# Patient Record
Sex: Male | Born: 1965
Health system: Southern US, Community
[De-identification: ages and names within clinical notes are randomized; demographics above are authoritative.]

## PROBLEM LIST (undated history)

## (undated) DIAGNOSIS — D509 Iron deficiency anemia, unspecified: Secondary | ICD-10-CM

## (undated) HISTORY — PX: ANKLE SURGERY: SHX546

## (undated) HISTORY — PX: ELBOW SURGERY: SHX618

---

## 2002-02-24 ENCOUNTER — Emergency Department (HOSPITAL_COMMUNITY): Admission: EM | Admit: 2002-02-24 | Discharge: 2002-02-24 | Payer: Self-pay | Admitting: Emergency Medicine

## 2003-11-18 ENCOUNTER — Emergency Department (HOSPITAL_COMMUNITY): Admission: EM | Admit: 2003-11-18 | Discharge: 2003-11-18 | Payer: Self-pay | Admitting: Emergency Medicine

## 2003-12-09 ENCOUNTER — Emergency Department (HOSPITAL_COMMUNITY): Admission: EM | Admit: 2003-12-09 | Discharge: 2003-12-10 | Payer: Self-pay | Admitting: Emergency Medicine

## 2004-03-06 ENCOUNTER — Emergency Department (HOSPITAL_COMMUNITY): Admission: EM | Admit: 2004-03-06 | Discharge: 2004-03-06 | Payer: Self-pay | Admitting: Emergency Medicine

## 2005-09-27 ENCOUNTER — Emergency Department (HOSPITAL_COMMUNITY): Admission: EM | Admit: 2005-09-27 | Discharge: 2005-09-27 | Payer: Self-pay | Admitting: Emergency Medicine

## 2006-09-04 ENCOUNTER — Emergency Department (HOSPITAL_COMMUNITY): Admission: EM | Admit: 2006-09-04 | Discharge: 2006-09-05 | Payer: Self-pay | Admitting: Emergency Medicine

## 2009-09-22 ENCOUNTER — Emergency Department (HOSPITAL_COMMUNITY): Admission: EM | Admit: 2009-09-22 | Discharge: 2009-09-22 | Payer: Self-pay | Admitting: Emergency Medicine

## 2009-11-24 ENCOUNTER — Emergency Department (HOSPITAL_COMMUNITY): Admission: EM | Admit: 2009-11-24 | Discharge: 2009-11-24 | Payer: Self-pay | Admitting: Emergency Medicine

## 2010-06-19 LAB — URINALYSIS, ROUTINE W REFLEX MICROSCOPIC
Glucose, UA: NEGATIVE mg/dL
Hgb urine dipstick: NEGATIVE
Ketones, ur: 15 mg/dL — AB
Nitrite: NEGATIVE
Protein, ur: NEGATIVE mg/dL
Specific Gravity, Urine: 1.025 (ref 1.005–1.030)
Urobilinogen, UA: 1 mg/dL (ref 0.0–1.0)
pH: 6 (ref 5.0–8.0)

## 2010-07-25 ENCOUNTER — Emergency Department (HOSPITAL_COMMUNITY)
Admission: EM | Admit: 2010-07-25 | Discharge: 2010-07-25 | Disposition: A | Discharge status: Home / Self Care | Payer: Self-pay | Attending: Emergency Medicine | Admitting: Emergency Medicine

## 2010-07-25 ENCOUNTER — Emergency Department (HOSPITAL_COMMUNITY): Payer: Self-pay

## 2010-07-25 DIAGNOSIS — W138XXA Fall from, out of or through other building or structure, initial encounter: Secondary | ICD-10-CM | POA: Insufficient documentation

## 2010-07-25 DIAGNOSIS — Y92009 Unspecified place in unspecified non-institutional (private) residence as the place of occurrence of the external cause: Secondary | ICD-10-CM | POA: Insufficient documentation

## 2010-07-25 DIAGNOSIS — S92009A Unspecified fracture of unspecified calcaneus, initial encounter for closed fracture: Secondary | ICD-10-CM | POA: Insufficient documentation

## 2010-09-02 ENCOUNTER — Emergency Department (HOSPITAL_COMMUNITY)
Admission: EM | Admit: 2010-09-02 | Discharge: 2010-09-02 | Disposition: A | Discharge status: Home / Self Care | Payer: Self-pay | Attending: Emergency Medicine | Admitting: Emergency Medicine

## 2010-09-02 DIAGNOSIS — R21 Rash and other nonspecific skin eruption: Secondary | ICD-10-CM | POA: Insufficient documentation

## 2010-09-02 DIAGNOSIS — A779 Spotted fever, unspecified: Secondary | ICD-10-CM | POA: Insufficient documentation

## 2014-07-15 ENCOUNTER — Emergency Department (HOSPITAL_COMMUNITY): Payer: 59

## 2014-07-15 ENCOUNTER — Encounter (HOSPITAL_COMMUNITY): Payer: Self-pay | Admitting: Emergency Medicine

## 2014-07-15 ENCOUNTER — Emergency Department (HOSPITAL_COMMUNITY)
Admission: EM | Admit: 2014-07-15 | Discharge: 2014-07-15 | Disposition: A | Discharge status: Home / Self Care | Payer: 59 | Attending: Emergency Medicine | Admitting: Emergency Medicine

## 2014-07-15 DIAGNOSIS — M25461 Effusion, right knee: Secondary | ICD-10-CM | POA: Insufficient documentation

## 2014-07-15 DIAGNOSIS — Z720 Tobacco use: Secondary | ICD-10-CM | POA: Diagnosis not present

## 2014-07-15 DIAGNOSIS — M25561 Pain in right knee: Secondary | ICD-10-CM | POA: Diagnosis present

## 2014-07-15 MED ORDER — HYDROCODONE-ACETAMINOPHEN 5-325 MG PO TABS: 1.0000 | ORAL_TABLET | ORAL | Status: DC | PRN

## 2014-07-15 MED ORDER — IBUPROFEN 600 MG PO TABS: 600.0000 mg | ORAL_TABLET | Freq: Four times a day (QID) | ORAL | Status: DC | PRN

## 2014-07-15 MED ORDER — HYDROCODONE-ACETAMINOPHEN 5-325 MG PO TABS: 2.0000 | ORAL_TABLET | ORAL | Status: DC | PRN

## 2014-07-15 NOTE — Discharge Instructions (Signed)
Knee Effusion The medical term for having fluid in your knee is effusion. This is often due to an internal derangement of the knee. This means something is wrong inside the knee. Some of the causes of fluid in the knee may be torn or worn cartilage, a torn ligament, or bleeding into the joint from an injury. Your knee is likely more difficult to bend and move. This is often because there is increased pain and pressure in the joint. The time it takes for recovery from a knee effusion depends on different factors, including:   Type of injury.  Your age.  Physical and medical conditions.  Rehabilitation Strategies. How long you will be away from your normal activities will depend on what kind of knee problem you have and how much damage is present. Your knee has two types of cartilage. Articular cartilage covers the bone ends and lets your knee bend and move smoothly. Two menisci, thick pads of cartilage that form a rim inside the joint, help absorb shock and stabilize your knee. Ligaments bind the bones together and support your knee joint. Muscles move the joint, help support your knee, and take stress off the joint itself. CAUSES  Often an effusion in the knee is caused by an injury to one of the menisci. This is often a tear in the cartilage. Recovery after a meniscus injury depends on how much meniscus is damaged and whether you have damaged other knee tissue. Small tears may heal on their own with conservative treatment. Conservative means rest, limited weight bearing activity and muscle strengthening exercises. Your recovery may take up to 6 weeks.  TREATMENT  Larger tears may require surgery. Meniscus injuries may be treated during arthroscopy. Arthroscopy is a procedure in which your surgeon uses a small telescope like instrument to look in your knee. Your caregiver can make a more accurate diagnosis (learning what is wrong) by performing an arthroscopic procedure. If your injury is on the inner  margin of the meniscus, your surgeon may trim the meniscus back to a smooth rim. In other cases your surgeon will try to repair a damaged meniscus with stitches (sutures). This may make rehabilitation take longer, but may provide better long term result by helping your knee keep its shock absorption capabilities. Ligaments which are completely torn usually require surgery for repair. HOME CARE INSTRUCTIONS  Use crutches as instructed.  If a brace is applied, use as directed.  Once you are home, an ice pack applied to your swollen knee may help with discomfort and help decrease swelling.  Keep your knee raised (elevated) when you are not up and around or on crutches.  Only take over-the-counter or prescription medicines for pain, discomfort, or fever as directed by your caregiver.  Your caregivers will help with instructions for rehabilitation of your knee. This often includes strengthening exercises.  You may resume a normal diet and activities as directed. SEEK MEDICAL CARE IF:   There is increased swelling in your knee.  You notice redness, swelling, or increasing pain in your knee.  An unexplained oral temperature above 102 F (38.9 C) develops. SEEK IMMEDIATE MEDICAL CARE IF:   You develop a rash.  You have difficulty breathing.  You have any allergic reactions from medications you may have been given.  There is severe pain with any motion of the knee. MAKE SURE YOU:   Understand these instructions.  Will watch your condition.  Will get help right away if you are not doing well or  get worse. Document Released: 06/10/2003 Document Revised: 06/12/2011 Document Reviewed: 08/14/2007 [facility] Patient Information 2015 [place], [place]. This information is not intended to replace advice given to you by your health care provider. Make sure you discuss any questions you have with your health care provider.  You may take the hydrocodone prescribed for pain relief.  This will make  you drowsy - do not drive within 4 hours of taking this medication.

## 2014-07-15 NOTE — ED Notes (Signed)
Pt complaining of right knee pain for 4 days .denies injury. Pt states does a lot of walking at work.

## 2014-07-15 NOTE — ED Notes (Signed)
Patient transported to X-ray 

## 2014-07-17 NOTE — ED Provider Notes (Signed)
CSN: [id number]     Arrival date & time 07/15/14  2048 History   First MD Initiated Contact with Patient 07/15/14 2210     Chief Complaint  Patient presents with  . Knee Pain     (Consider location/radiation/quality/duration/timing/severity/associated sxs/prior Treatment) Patient is a 49 y.o. male presenting with knee pain. The history is provided by the patient.  Knee Pain Location:  Knee Time since incident:  4 days Injury: no (no recent injury, but walks with his job constantly for 8 hours/day)   Knee location:  R knee Pain details:    Quality:  Pressure and aching   Radiates to:  Does not radiate   Severity:  Moderate   Onset quality:  Gradual   Duration:  4 days   Timing:  Constant   Progression:  Worsening Chronicity:  Recurrent (had similar problem years ago which resolved without intervention) Dislocation: no   Prior injury to area:  Yes (had knee injury as teenager playing sports.) Relieved by:  Nothing Worsened by:  Flexion (knee feels tight with flexion) Ineffective treatments:  Rest and ice Associated symptoms: swelling   Associated symptoms: no back pain, no fever, no muscle weakness, no stiffness and no tingling     History reviewed. No pertinent past medical history. Past Surgical History  Procedure Laterality Date  . Elbow surgery    . Ankle surgery Left    No family history on file. History  Substance Use Topics  . Smoking status: Current Every Day Smoker    Types: Cigarettes  . Smokeless tobacco: Not on file  . Alcohol Use: No    Review of Systems  Constitutional: Negative for fever.  Musculoskeletal: Positive for joint swelling and arthralgias. Negative for myalgias, back pain and stiffness.  Neurological: Negative for weakness and numbness.      Allergies  Codeine  Home Medications   Prior to Admission medications   Medication Sig Start Date End Date Taking? Authorizing Provider  HYDROcodone-acetaminophen (NORCO/VICODIN) 5-325 MG per  tablet Take 1 tablet by mouth every 4 (four) hours as needed. 07/15/14   Burgess AmorJulie Simon Aaberg, PA-C  HYDROcodone-acetaminophen (NORCO/VICODIN) 5-325 MG per tablet Take 2 tablets by mouth every 4 (four) hours as needed. 07/15/14   Burgess AmorJulie Edye Hainline, PA-C  ibuprofen (ADVIL,MOTRIN) 600 MG tablet Take 1 tablet (600 mg total) by mouth every 6 (six) hours as needed for moderate pain. 07/15/14   Burgess AmorJulie Prisma Decarlo, PA-C   BP 131/78 mmHg  Pulse 81  Temp(Src) 97.8 F (36.6 C) (Oral)  Resp 16  Ht 5\' 11"  (1.803 m)  Wt 160 lb (72.576 kg)  BMI 22.33 kg/m2  SpO2 98% Physical Exam  Constitutional: He appears well-developed and well-nourished.  HENT:  Head: Atraumatic.  Neck: Normal range of motion.  Cardiovascular:  Pulses equal bilaterally  Musculoskeletal: He exhibits edema and tenderness.       Right knee: He exhibits decreased range of motion and effusion. He exhibits no LCL laxity, normal meniscus and no MCL laxity. Tenderness found.  Neurological: He is alert. He has normal strength. He displays normal reflexes. No sensory deficit.  Skin: Skin is warm and dry.  Psychiatric: He has a normal mood and affect.    ED Course  Procedures (including critical care time) Labs Review Labs Reviewed - No data to display  Imaging Review Dg Knee Complete 4 Views Right  07/15/2014   CLINICAL DATA:  Right anterior knee pain, proximal to patellar radiating up right thigh. No known injury.  EXAM: RIGHT KNEE -  COMPLETE 4+ VIEW  COMPARISON:  None.  FINDINGS: No fracture or dislocation. The alignment and joint spaces are maintained. Bone mineralization is normal. No erosion or periosteal reaction. There is a small joint effusion. Anterior soft tissue edema is seen.  IMPRESSION: Joint effusion and anterior soft tissue edema.  No bony abnormality.   Electronically Signed   By: Rubye Oaks M.D.   On: 07/15/2014 21:33     EKG Interpretation None      MDM   Final diagnoses:  Knee effusion, right    Patients labs and/or  radiological studies were reviewed and considered during the medical decision making and disposition process.  Results were also discussed with patient. Pt placed in knee immobilizer, crutches given, advised RICE, hydrocodone, ibuprofen.  No erythema of the joint - exam not c/w infected joint.  Suspect he has cartilage and/or ligament inflammation/degenerative changes.  No crepitus appreciated with ROM.    Burgess Amor, PA-C 07/17/14 1323  Mancel Bale, MD 07/18/14 0600

## 2014-07-28 MED FILL — Hydrocodone-Acetaminophen Tab 5-325 MG: ORAL | Qty: 6 | Status: AC

## 2014-07-30 ENCOUNTER — Ambulatory Visit: Payer: 59 | Admitting: Orthopedic Surgery

## 2015-09-05 ENCOUNTER — Encounter (HOSPITAL_COMMUNITY): Payer: Self-pay | Admitting: *Deleted

## 2015-09-05 ENCOUNTER — Emergency Department (HOSPITAL_COMMUNITY)
Admission: EM | Admit: 2015-09-05 | Discharge: 2015-09-05 | Disposition: A | Discharge status: Home / Self Care | Payer: 59 | Attending: Emergency Medicine | Admitting: Emergency Medicine

## 2015-09-05 DIAGNOSIS — M79642 Pain in left hand: Secondary | ICD-10-CM | POA: Diagnosis present

## 2015-09-05 DIAGNOSIS — F17210 Nicotine dependence, cigarettes, uncomplicated: Secondary | ICD-10-CM | POA: Insufficient documentation

## 2015-09-05 DIAGNOSIS — L03114 Cellulitis of left upper limb: Secondary | ICD-10-CM | POA: Diagnosis not present

## 2015-09-05 MED ORDER — CEPHALEXIN 500 MG PO CAPS
500.0000 mg | ORAL_CAPSULE | Freq: Three times a day (TID) | ORAL | Status: AC
Start: 2015-09-05 — End: 2015-09-15

## 2015-09-05 MED ORDER — CEPHALEXIN 500 MG PO CAPS
500.0000 mg | ORAL_CAPSULE | Freq: Once | ORAL | Status: AC
  Administered 2015-09-05: 500 mg via ORAL
  Filled 2015-09-05: qty 1

## 2015-09-05 MED ORDER — HYDROCODONE-ACETAMINOPHEN 5-325 MG PO TABS
1.0000 | ORAL_TABLET | Freq: Once | ORAL | Status: AC
  Administered 2015-09-05: 1 via ORAL
  Filled 2015-09-05: qty 1

## 2015-09-05 MED ORDER — HYDROCODONE-ACETAMINOPHEN 5-325 MG PO TABS: 1.0000 | ORAL_TABLET | Freq: Four times a day (QID) | ORAL | Status: DC | PRN

## 2015-09-05 MED ORDER — SULFAMETHOXAZOLE-TRIMETHOPRIM 800-160 MG PO TABS: 1.0000 | ORAL_TABLET | Freq: Two times a day (BID) | ORAL | Status: DC

## 2015-09-05 MED ORDER — SULFAMETHOXAZOLE-TRIMETHOPRIM 800-160 MG PO TABS
1.0000 | ORAL_TABLET | Freq: Once | ORAL | Status: AC
  Administered 2015-09-05: 1 via ORAL
  Filled 2015-09-05: qty 1

## 2015-09-05 NOTE — ED Notes (Addendum)
Pt states that he was cleaning out his gutters on Friday, felt as if he was bitten by something, has swelling, redness noted to left hand, pt is concerned because the redness has started to spread up to his forearm area,

## 2015-09-05 NOTE — ED Provider Notes (Signed)
CSN: [id number]     Arrival date & time 09/05/15  2032 History  By signing my name below, I, Jeff Anderson, attest that this documentation has been prepared under the direction and in the presence of Jeff Memos, MD. Electronically Signed: Ronney Anderson, ED Scribe. 09/05/2015. 9:28 PM.   Chief Complaint  Patient presents with  . Insect Bite   The history is provided by the patient. No language interpreter was used.    HPI Comments: Jeff Anderson is a 50 y.o. male who presents to the Emergency Department complaining of a gradual-onset, constant area of pain, redness, and swelling to his left hand that has been gradually spreading up his forearm, with onset 2 days ago. Patient states he was cleaning the gutters that day and felt as if he was bitten by a small insect/spider, although he did not see exactly what bit him. He states he feels positive that he was not bitten by a snake. Patient states he had tried soaking his arm in Epsom salts, with some relief to his swelling. He denies a history of any prior spider bites. Patient does report a history of RMSF and states the tick bite area had become swollen and red at that time. He denies a history of chronic medical conditions, including DM. He denies any known fever, nausea, or vomiting. Patient states he is right-hand-dominant. He reports no known allergies.   History reviewed. No pertinent past medical history. Past Surgical History  Procedure Laterality Date  . Elbow surgery    . Ankle surgery Left    No family history on file. Social History  Substance Use Topics  . Smoking status: Current Every Day Smoker    Types: Cigarettes  . Smokeless tobacco: None  . Alcohol Use: No    Review of Systems  Constitutional: Negative for fever.  Gastrointestinal: Negative for nausea and vomiting.  Skin: Positive for color change (redness, swelling, and pain to left hand).  All other systems reviewed and are negative.   Allergies  Codeine  Home  Medications   Prior to Admission medications   Medication Sig Start Date End Date Taking? Authorizing Provider  cephALEXin (KEFLEX) 500 MG capsule Take 1 capsule (500 mg total) by mouth 3 (three) times daily. 09/05/15 09/15/15  Jeff Memos, MD  HYDROcodone-acetaminophen (NORCO/VICODIN) 5-325 MG tablet Take 1-2 tablets by mouth every 6 (six) hours as needed for moderate pain. 09/05/15   Jeff Memos, MD  ibuprofen (ADVIL,MOTRIN) 600 MG tablet Take 1 tablet (600 mg total) by mouth every 6 (six) hours as needed for moderate pain. 07/15/14   Burgess Amor, PA-C  sulfamethoxazole-trimethoprim (BACTRIM DS,SEPTRA DS) 800-160 MG tablet Take 1 tablet by mouth 2 (two) times daily. 09/05/15   Barbara Cower Jeananne Bedwell, MD   BP 160/94 mmHg  Pulse 80  Temp(Src) 97.8 F (36.6 C) (Oral)  Resp 18  Ht  (1.803 m)  Wt 164 lb (74.39 kg)  BMI 22.88 kg/m2  SpO2 100% Physical Exam  Constitutional: He is oriented to person, place, and time. He appears well-developed and well-nourished. No distress.  HENT:  Head: Normocephalic and atraumatic.  Eyes: Conjunctivae and EOM are normal.  Neck: Neck supple. No tracheal deviation present.  Cardiovascular: Normal rate, regular rhythm, normal heart sounds and intact distal pulses.  Exam reveals no gallop and no friction rub.   No murmur heard. Pulmonary/Chest: Effort normal and breath sounds normal. No respiratory distress. He has no wheezes. He has no rales. He exhibits no tenderness.  Lungs  are clear to auscultation.   Musculoskeletal: Normal range of motion. He exhibits edema and tenderness.  LUE: Erythema over dorsum of entire left hand, with some erythema over dorsum of first digit. Significant swelling with erythema and warmth. Hand is held in normal position; no flexion. No tenderness over flexor sheaths. No erythema on the volar aspect of hand, but he does have erythema to the volar aspect of arm, extending up to the antecubital fossa. No pain with ROM of the joints.   Neurological: He is alert and oriented to person, place, and time.  Skin: Skin is warm and dry.  Psychiatric: He has a normal mood and affect. His behavior is normal.  Nursing note and vitals reviewed.   ED Course  Procedures (including critical care time)  DIAGNOSTIC STUDIES: Oxygen Saturation is 100% on RA, normal by my interpretation.    COORDINATION OF CARE: 8:59 PM - Discussed treatment plan with pt at bedside which includes Rx Keflex and Bactrim, as well as Norco prn for pain. Pt verbalized understanding and agreed to plan.   MDM   Final diagnoses:  Cellulitis of left upper extremity    50-year-old male with cellulitis on his left hand. Progressive worsened throughout the last 2 days. No evidence of flexor tenosynovitis or sepsis at this time. Started on Bactrim and Keflex. Short course of pain medication given as well. We'll follow up with primary doctor in a few days to ensure no worsening. If his symptoms are not improving in 2-3 days he'll return here for further workup.   New Prescriptions: Discharge Medication List as of 09/05/2015  9:23 PM    START taking these medications   Details  cephALEXin (KEFLEX) 500 MG capsule Take 1 capsule (500 mg total) by mouth 3 (three) times daily., Starting 09/05/2015, Until Wed 09/15/15, Print    sulfamethoxazole-trimethoprim (BACTRIM DS,SEPTRA DS) 800-160 MG tablet Take 1 tablet by mouth 2 (two) times daily., Starting 09/05/2015, Until Discontinued, Print        I have personally and contemperaneously reviewed labs and imaging and used in my decision making as above.   A medical screening exam was performed and I feel the patient has had an appropriate workup for their chief complaint at this time and likelihood of emergent condition existing is low and thus workup can continue on an outpatient basis.. Their vital signs are stable. They have been counseled on decision, discharge, follow up and which symptoms necessitate immediate return to  the emergency department.  They verbally stated understanding and agreement with plan and discharged in stable condition.    I personally performed the services described in this documentation, which was scribed in my presence. The recorded information has been reviewed and is accurate.      Jeff MemosJason Robin Petrakis, MD 09/05/15 [phone]

## 2017-07-21 ENCOUNTER — Encounter (HOSPITAL_COMMUNITY): Payer: Self-pay | Admitting: Emergency Medicine

## 2017-07-21 ENCOUNTER — Other Ambulatory Visit: Payer: Self-pay

## 2017-07-21 ENCOUNTER — Emergency Department (HOSPITAL_COMMUNITY): Payer: BLUE CROSS/BLUE SHIELD

## 2017-07-21 ENCOUNTER — Emergency Department (HOSPITAL_COMMUNITY)
Admission: EM | Admit: 2017-07-21 | Discharge: 2017-07-21 | Disposition: A | Discharge status: Home / Self Care | Payer: BLUE CROSS/BLUE SHIELD | Attending: Emergency Medicine | Admitting: Emergency Medicine

## 2017-07-21 DIAGNOSIS — X500XXA Overexertion from strenuous movement or load, initial encounter: Secondary | ICD-10-CM | POA: Insufficient documentation

## 2017-07-21 DIAGNOSIS — F17210 Nicotine dependence, cigarettes, uncomplicated: Secondary | ICD-10-CM | POA: Insufficient documentation

## 2017-07-21 DIAGNOSIS — Z79899 Other long term (current) drug therapy: Secondary | ICD-10-CM | POA: Diagnosis not present

## 2017-07-21 DIAGNOSIS — S6992XA Unspecified injury of left wrist, hand and finger(s), initial encounter: Secondary | ICD-10-CM | POA: Diagnosis not present

## 2017-07-21 DIAGNOSIS — M25532 Pain in left wrist: Secondary | ICD-10-CM

## 2017-07-21 MED ORDER — IBUPROFEN 400 MG PO TABS
600.0000 mg | ORAL_TABLET | Freq: Once | ORAL | Status: AC
Start: 2017-07-21 — End: 2017-07-21
  Administered 2017-07-21: 600 mg via ORAL
  Filled 2017-07-21: qty 2

## 2017-07-21 MED ORDER — MELOXICAM 15 MG PO TABS: 15.0000 mg | ORAL_TABLET | Freq: Every day | ORAL | 0 refills | Status: DC

## 2017-07-21 MED ORDER — ACETAMINOPHEN 325 MG PO TABS
650.0000 mg | ORAL_TABLET | Freq: Once | ORAL | Status: AC
  Administered 2017-07-21: 650 mg via ORAL
  Filled 2017-07-21: qty 2

## 2017-07-21 NOTE — Discharge Instructions (Signed)
Please read and follow all provided instructions.  Your diagnoses today include:  1. Acute pain of left wrist     Tests performed today include: An x-ray of your wrist - does NOT show any broken bones Vital signs. See below for your results today.   Medications prescribed:   Take any prescribed medications only as directed.  You are prescribed Mobic, a non-steroidal anti-inflammatory agent (NSAID) for pain. You may take 15 mg every 24 hours. If still requiring this medication around the clock for acute pain after 10 days, please see your primary healthcare provider.  Women who are pregnant, breastfeeding, or planning on becoming pregnant should not take non-steroidal anti-inflammatories such as   You may combine this medication with Tylenol, 650 mg every 6 hours, so you are receiving something for pain every 3 hours.  This is not a long-term medication unless under the care and direction of your primary provider. Taking this medication long-term and not under the supervision of a healthcare provider could increase the risk of stomach ulcers, kidney problems, and cardiovascular problems such as high blood pressure.   Home care instructions:  Follow any educational materials contained in this packet Wear your splint for at least one week or until seen by a physician for a follow-up examination. Follow R.I.C.E. Protocol: R - rest your injury  I  - use ice on injury without applying directly to skin C - compress injury with bandage or splint E - elevate the injury above the level of your heart as much as possible to reduce pain and swelling  Please rest your wrist until Tuesday.  Please wear the wrist splint for comfort while you are at work when you go back to work.  Follow-up instructions: Please follow-up with your primary care provider or the provided orthopedic (bone specialist) if you continue to have significant pain or trouble using your wrist in 1 week. In this case you may  have a severe injury that requires further care.    Return instructions:  Please return if your fingers are numb or tingling, appear very red, white, gray or blue, or you have severe pain (also elevate wrist and loosen splint or wrap) Please return if you have difficulty moving your fingers. Please return to the Emergency Department if you experience worsening symptoms.  Please return if you have any other emergent concerns.  Additional Information:  Your vital signs today were: BP 134/84 (BP Location: Right Arm)    Pulse 76    Temp 97.7 F (36.5 C) (Oral)    Resp 16    Ht 5\' 11"  (1.803 m)    Wt 71.7 kg (158 lb)    SpO2 99%    BMI 22.04 kg/m  If your blood pressure (BP) was elevated above 135/85 this visit, please have this repeated by your doctor within one month. -------------- Wrist injuries are frequent in adults and children. A sprain is an injury to the ligaments that hold your bones together. A strain is an injury to muscle or muscle tendons (cord like structure) from stretching or pulling.   Remember the importance of follow-up and possible follow-up x-rays. Improvement in pain level is not 100% insurance of not having a fracture. --------------

## 2017-07-21 NOTE — ED Triage Notes (Signed)
Left wrist pain x 1 week. Patient states pain and swelling. He says the pain is so bad he can't sleep now. Denies injury or history of gout.

## 2017-07-21 NOTE — ED Provider Notes (Signed)
[facility] EMERGENCY DEPARTMENT Provider Note   CSN: [id number] Arrival date & time: 07/21/17  1138     History   Chief Complaint Chief Complaint  Patient presents with  . Wrist Pain    HPI Jeff Anderson is a 52 y.o. male.  HPI  Patient is a 52 year old male with no significant past medical history presenting for left wrist pain and swelling.  Patient reports his wrist has been hurting for 1 week, and while he does not identify specific injury associated with it, reports that he is pushing up to 1200 pounds with a flexed wrist at work daily.  Patient reports that the pain is greatest radiating to his thumb and pinky fingers.  Patient reports repeated motion of flexion and extension of her wrists at work.  Patient is right-hand dominant.  Patient denies that there is ever been any erythema of the wrist, fall on outstretched hand, or other trauma to the wrist.  Patient denies any fever, chills, edema of the entire left upper extremity, or other joint pain.  Patient denies taking any over-the-counter medications for his symptoms.  Patient report he has been using his wife's wrist splint for symptoms, however it is too small for him.  History reviewed. No pertinent past medical history.  There are no active problems to display for this patient.   Past Surgical History:  Procedure Laterality Date  . ANKLE SURGERY Left   . ELBOW SURGERY          Home Medications    Prior to Admission medications   Medication Sig Start Date End Date Taking? Authorizing Provider  HYDROcodone-acetaminophen (NORCO/VICODIN) 5-325 MG tablet Take 1-2 tablets by mouth every 6 (six) hours as needed for moderate pain. 09/05/15   Mesner, Barbara Cower, MD  ibuprofen (ADVIL,MOTRIN) 600 MG tablet Take 1 tablet (600 mg total) by mouth every 6 (six) hours as needed for moderate pain. 07/15/14   Burgess Amor, PA-C  meloxicam (MOBIC) 15 MG tablet Take 1 tablet (15 mg total) by mouth daily. 07/21/17   Aviva Kluver B,  PA-C  sulfamethoxazole-trimethoprim (BACTRIM DS,SEPTRA DS) 800-160 MG tablet Take 1 tablet by mouth 2 (two) times daily. 09/05/15   Mesner, Barbara Cower, MD    Family History No family history on file.  Social History Social History   Tobacco Use  . Smoking status: Current Every Day Smoker    Types: Cigarettes  . Smokeless tobacco: Never Used  Substance Use Topics  . Alcohol use: No  . Drug use: No     Allergies   Codeine   Review of Systems Review of Systems  Constitutional: Negative for chills and fever.  Musculoskeletal: Positive for arthralgias and joint swelling.  Skin: Negative for color change and rash.  Neurological: Negative for weakness and numbness.     Physical Exam Updated Vital Signs BP 134/84 (BP Location: Right Arm)   Pulse 76   Temp 97.7 F (36.5 C) (Oral)   Resp 16   Ht 5\' 11"  (1.803 m)   Wt 71.7 kg (158 lb)   SpO2 99%   BMI 22.04 kg/m   Physical Exam  Constitutional: He appears well-developed and well-nourished. No distress.  Sitting comfortably in bed.  HENT:  Head: Normocephalic and atraumatic.  Eyes: Conjunctivae are normal. Right eye exhibits no discharge. Left eye exhibits no discharge.  EOMs normal to gross examination.  Neck: Normal range of motion.  Cardiovascular: Normal rate and regular rhythm.  Intact, 2+ radial and ulnar pulse of LUE.  Pulmonary/Chest:  Normal respiratory effort. Patient converses comfortably. No audible wheeze or stridor.  Abdominal: He exhibits no distension.  Musculoskeletal: Normal range of motion.  Left wrist exhibits some lateral edema, but no erythema or bogginess.  There is no edema of the left upper extremity proximally to the wrist.  Patient is able to perform full range of motion of flexion, extension, inversion and eversion of the left wrist.  Patient able to perform all motor functions of radial, ulnar, and median nerve.  Sensation intact to light touch in entire distal left upper extremity.  Patient does  exhibit Increased pain with Phalen's and Tinel's testing.  Neurological: He is alert.  Cranial nerves intact to gross observation. Patient moves extremities without difficulty.  Skin: Skin is warm and dry. He is not diaphoretic.  Psychiatric: He has a normal mood and affect. His behavior is normal. Judgment and thought content normal.  Nursing note and vitals reviewed.    ED Treatments / Results  Labs (all labs ordered are listed, but only abnormal results are displayed) Labs Reviewed - No data to display  EKG None  Radiology Dg Wrist Complete Left  Result Date: 07/21/2017 CLINICAL DATA:  Pain without trauma. EXAM: LEFT WRIST - COMPLETE 3+ VIEW COMPARISON:  12/09/2003 FINDINGS: No acute fracture or dislocation. Scaphoid intact. Joint spaces are maintained for age. IMPRESSION: No acute osseous abnormality. Electronically Signed   By: Jeronimo GreavesKyle  Talbot M.D.   On: 07/21/2017 12:32    Procedures Procedures (including critical care time)  Medications Ordered in ED Medications  ibuprofen (ADVIL,MOTRIN) tablet 600 mg (600 mg Oral Given 07/21/17 1310)  acetaminophen (TYLENOL) tablet 650 mg (650 mg Oral Given 07/21/17 1309)     Initial Impression / Assessment and Plan / ED Course  I have reviewed the triage vital signs and the nursing notes.  Pertinent labs & imaging results that were available during my care of the patient were reviewed by me and considered in my medical decision making (see chart for details).     Patient is nontoxic-appearing, afebrile, and in no acute distress.  Differential diagnosis includes median neuropathy, ulnar neuropathy, bursitis of wrist, wrist sprain.  Doubt septic arthritis given atypical location, no erythema, and minimal pain with passive range of motion.  Patient provided with a wrist splint, instructed to rest, ice, elevate, compress as well as take anti-inflammatories for 7 days.  Mobic dispensed.  Patient instructed to follow-up with his primary care  provider for recheck.  Patient return precautions for any increasing pain, pallor, paresthesias, or edema of left upper extremity.  Patient is in understanding and agrees with the plan of care.  Final Clinical Impressions(s) / ED Diagnoses   Final diagnoses:  Acute pain of left wrist    ED Discharge Orders        Ordered    meloxicam (MOBIC) 15 MG tablet  Daily     07/21/17 1355       Delia ChimesMurray, Thatiana Renbarger B, PA-C 07/21/17 Marland [id number]    Pickering, Nathan, MD 07/22/17 1527

## 2017-07-21 NOTE — ED Notes (Signed)
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

## 2017-09-26 DIAGNOSIS — F5221 Male erectile disorder: Secondary | ICD-10-CM | POA: Diagnosis not present

## 2017-09-26 DIAGNOSIS — Z0001 Encounter for general adult medical examination with abnormal findings: Secondary | ICD-10-CM | POA: Diagnosis not present

## 2017-09-26 DIAGNOSIS — Z23 Encounter for immunization: Secondary | ICD-10-CM | POA: Diagnosis not present

## 2017-09-26 DIAGNOSIS — F17210 Nicotine dependence, cigarettes, uncomplicated: Secondary | ICD-10-CM | POA: Diagnosis not present

## 2017-09-26 DIAGNOSIS — Z6821 Body mass index (BMI) 21.0-21.9, adult: Secondary | ICD-10-CM | POA: Diagnosis not present

## 2017-09-26 DIAGNOSIS — I10 Essential (primary) hypertension: Secondary | ICD-10-CM | POA: Diagnosis not present

## 2017-09-26 DIAGNOSIS — D519 Vitamin B12 deficiency anemia, unspecified: Secondary | ICD-10-CM | POA: Diagnosis not present

## 2017-09-26 DIAGNOSIS — F331 Major depressive disorder, recurrent, moderate: Secondary | ICD-10-CM | POA: Diagnosis not present

## 2017-10-02 DIAGNOSIS — D519 Vitamin B12 deficiency anemia, unspecified: Secondary | ICD-10-CM | POA: Diagnosis not present

## 2017-10-09 DIAGNOSIS — D519 Vitamin B12 deficiency anemia, unspecified: Secondary | ICD-10-CM | POA: Diagnosis not present

## 2017-10-13 DIAGNOSIS — I10 Essential (primary) hypertension: Secondary | ICD-10-CM | POA: Diagnosis not present

## 2017-10-13 DIAGNOSIS — F5221 Male erectile disorder: Secondary | ICD-10-CM | POA: Diagnosis not present

## 2017-10-13 DIAGNOSIS — F17210 Nicotine dependence, cigarettes, uncomplicated: Secondary | ICD-10-CM | POA: Diagnosis not present

## 2017-10-13 DIAGNOSIS — F331 Major depressive disorder, recurrent, moderate: Secondary | ICD-10-CM | POA: Diagnosis not present

## 2017-10-13 DIAGNOSIS — Z6820 Body mass index (BMI) 20.0-20.9, adult: Secondary | ICD-10-CM | POA: Diagnosis not present

## 2017-10-17 DIAGNOSIS — D519 Vitamin B12 deficiency anemia, unspecified: Secondary | ICD-10-CM | POA: Diagnosis not present

## 2017-10-24 DIAGNOSIS — D519 Vitamin B12 deficiency anemia, unspecified: Secondary | ICD-10-CM | POA: Diagnosis not present

## 2017-11-29 DIAGNOSIS — F5221 Male erectile disorder: Secondary | ICD-10-CM | POA: Diagnosis not present

## 2017-11-29 DIAGNOSIS — I10 Essential (primary) hypertension: Secondary | ICD-10-CM | POA: Diagnosis not present

## 2017-11-29 DIAGNOSIS — F17210 Nicotine dependence, cigarettes, uncomplicated: Secondary | ICD-10-CM | POA: Diagnosis not present

## 2017-11-29 DIAGNOSIS — F331 Major depressive disorder, recurrent, moderate: Secondary | ICD-10-CM | POA: Diagnosis not present

## 2018-04-10 DIAGNOSIS — Z23 Encounter for immunization: Secondary | ICD-10-CM | POA: Diagnosis not present

## 2018-04-10 DIAGNOSIS — F17210 Nicotine dependence, cigarettes, uncomplicated: Secondary | ICD-10-CM | POA: Diagnosis not present

## 2018-04-10 DIAGNOSIS — F5221 Male erectile disorder: Secondary | ICD-10-CM | POA: Diagnosis not present

## 2018-04-10 DIAGNOSIS — I10 Essential (primary) hypertension: Secondary | ICD-10-CM | POA: Diagnosis not present

## 2018-04-10 DIAGNOSIS — F331 Major depressive disorder, recurrent, moderate: Secondary | ICD-10-CM | POA: Diagnosis not present

## 2018-04-29 ENCOUNTER — Other Ambulatory Visit: Payer: Self-pay

## 2018-04-29 DIAGNOSIS — R200 Anesthesia of skin: Secondary | ICD-10-CM

## 2018-04-29 DIAGNOSIS — R230 Cyanosis: Secondary | ICD-10-CM | POA: Diagnosis not present

## 2018-04-29 DIAGNOSIS — Z6821 Body mass index (BMI) 21.0-21.9, adult: Secondary | ICD-10-CM | POA: Diagnosis not present

## 2018-05-01 ENCOUNTER — Ambulatory Visit (HOSPITAL_COMMUNITY)
Admission: RE | Admit: 2018-05-01 | Discharge: 2018-05-01 | Disposition: A | Discharge status: Home / Self Care | Payer: BLUE CROSS/BLUE SHIELD | Source: Ambulatory Visit | Attending: Vascular Surgery | Admitting: Vascular Surgery

## 2018-05-01 ENCOUNTER — Other Ambulatory Visit: Payer: Self-pay

## 2018-05-01 ENCOUNTER — Encounter: Payer: Self-pay | Admitting: Vascular Surgery

## 2018-05-01 ENCOUNTER — Ambulatory Visit (INDEPENDENT_AMBULATORY_CARE_PROVIDER_SITE_OTHER): Payer: Self-pay | Admitting: Vascular Surgery

## 2018-05-01 VITALS — BP 141/90 | HR 69 | Temp 97.8°F | Resp 20 | Ht 71.0 in | Wt 158.0 lb

## 2018-05-01 DIAGNOSIS — R200 Anesthesia of skin: Secondary | ICD-10-CM | POA: Diagnosis not present

## 2018-05-01 DIAGNOSIS — M79644 Pain in right finger(s): Secondary | ICD-10-CM

## 2018-05-01 NOTE — Progress Notes (Signed)
REASON FOR CONSULT:    Numb right middle finger.  The consult is requested by Charlann Noss.   HPI:   Jeff Anderson is a pleasant 53 y.o. male, who injured his right middle finger several months ago at work and lost his fingernail.  Approximately 2 weeks ago he developed some pain in the right middle finger distally with paresthesias.I reviewed the notes from the referring office.  The patient was noted to have some cyanosis of the right middle finger and is sent for vascular consultation.  The patient was seen there on 04/29/2018 and the symptoms have been going on for 5 days.  Of note, he does have to go into a freezer at work at times for no more than 15 minutes.  The cold does seem to aggravate the pain in the distal right middle finger.  He is a smoker and smokes 1-1/2 packs/day.  He is from this area and is not from a cold damp environment nor has he had any history of frostbite or traumatic injury to the hand short of the wound from several months ago.  He denies any history of diabetes, hypertension, hypercholesterolemia, family history of premature cardiovascular disease.  History reviewed. No pertinent past medical history.  History reviewed. No pertinent family history.  There is no family history of premature cardiovascular disease.  SOCIAL HISTORY: He smokes 1-1/2 packs/day. Social History   Socioeconomic History  . Marital status: Married    Spouse name: Not on file  . Number of children: Not on file  . Years of education: Not on file  . Highest education level: Not on file  Occupational History  . Not on file  Social Needs  . Financial resource strain: Not on file  . Food insecurity:    Worry: Not on file    Inability: Not on file  . Transportation needs:    Medical: Not on file    Non-medical: Not on file  Tobacco Use  . Smoking status: Current Every Day Smoker    Packs/day: 1.50    Years: 30.00    Pack years: 45.00    Types: Cigarettes  . Smokeless  tobacco: Never Used  Substance and Sexual Activity  . Alcohol use: No  . Drug use: No  . Sexual activity: Not on file  Lifestyle  . Physical activity:    Days per week: Not on file    Minutes per session: Not on file  . Stress: Not on file  Relationships  . Social connections:    Talks on phone: Not on file    Gets together: Not on file    Attends religious service: Not on file    Active member of club or organization: Not on file    Attends meetings of clubs or organizations: Not on file    Relationship status: Not on file  . Intimate partner violence:    Fear of current or ex partner: Not on file    Emotionally abused: Not on file    Physically abused: Not on file    Forced sexual activity: Not on file  Other Topics Concern  . Not on file  Social History Narrative  . Not on file    Allergies  Allergen Reactions  . Codeine     Current Outpatient Medications  Medication Sig Dispense Refill  . ibuprofen (ADVIL,MOTRIN) 600 MG tablet Take 1 tablet (600 mg total) by mouth every 6 (six) hours as needed for moderate pain. 30 tablet 0  .  sildenafil (REVATIO) 20 MG tablet TAKE FIVE (5) TABLETS BY MOUTH ONE HOUR BEFORE INTERCOURSE AS NEEDED     No current facility-administered medications for this visit.     REVIEW OF SYSTEMS:  [X]  denotes positive finding, [ ]  denotes negative finding Cardiac  Comments:  Chest pain or chest pressure:    Shortness of breath upon exertion:    Short of breath when lying flat:    Irregular heart rhythm:        Vascular    Pain in calf, thigh, or hip brought on by ambulation:    Pain in feet at night that wakes you up from your sleep:     Blood clot in your veins:    Leg swelling:         Pulmonary    Oxygen at home:    Productive cough:     Wheezing:         Neurologic    Sudden weakness in arms or legs:     Sudden numbness in arms or legs:     Sudden onset of difficulty speaking or slurred speech:    Temporary loss of vision in  one eye:     Problems with dizziness:         Gastrointestinal    Blood in stool:     Vomited blood:         Genitourinary    Burning when urinating:     Blood in urine:        Psychiatric    Major depression:         Hematologic    Bleeding problems:    Problems with blood clotting too easily:        Skin    Rashes or ulcers:        Constitutional    Fever or chills:     PHYSICAL EXAM:   Vitals:   05/01/18 0957  BP: (!) 141/90  Pulse: 69  Resp: 20  Temp: 97.8 F (36.6 C)  SpO2: 99%  Weight: 158 lb (71.7 kg)  Height: 5\' 11"  (1.803 m)    GENERAL: The patient is a well-nourished male, in no acute distress. The vital signs are documented above. CARDIAC: There is a regular rate and rhythm.  VASCULAR: I do not detect carotid or subclavian bruits. He has palpable radial pulses bilaterally. He has palpable posterior tibial pulses bilaterally. PULMONARY: There is good air exchange bilaterally without wheezing or rales. ABDOMEN: Soft and non-tender with normal pitched bowel sounds.  MUSCULOSKELETAL: There are no major deformities or cyanosis. NEUROLOGIC: No focal weakness or paresthesias are detected. SKIN: The distal aspect of the right middle finger is somewhat indurated with no significant cyanosis currently. PSYCHIATRIC: The patient has a normal affect.  DATA:    UPPER EXTREMITY ARTERIAL DUPLEX: I have independently interpreted the patient's right upper extremity arterial duplex.  This shows triphasic Doppler signals throughout the subclavian, axillary, brachial, radial, and ulnar arteries.  LABS: Total cholesterol is 166.  Creatinine 0.81.  White blood cell count 3.9.  Platelet count is 254,000.  ASSESSMENT & PLAN:   PARESTHESIAS DISTAL RIGHT MIDDLE FINGER: This patient has no evidence of large vessel disease in his upper extremity on the right.  He has triphasic signals at the wrist and a palpable radial pulse.  He had a slight crush injury to the distal right  middle finger several weeks ago and lost his fingernail.  I think the cold weather recently has aggravated this and  this explains his paresthesias in the fingertip with some pain.  I have had a long discussion with him about the importance of tobacco cessation.  We discussed its effects on atherosclerosis but also on vasospasm in the microcirculation.  I encouraged him to keep his hands warm and to wear gloves when he is in the freezer.  If his symptoms worsen then certainly we would be happy to refer him to 1 of the hand surgeons for further evaluation.  However I think this will gradually improve.  I reassured him that he had excellent arterial flow in the larger arteries.   Waverly Ferrarihristopher Finnegan [facility] [phone]

## 2018-05-30 ENCOUNTER — Encounter: Payer: Self-pay | Admitting: Family Medicine

## 2018-08-02 DIAGNOSIS — F17210 Nicotine dependence, cigarettes, uncomplicated: Secondary | ICD-10-CM | POA: Diagnosis not present

## 2018-08-02 DIAGNOSIS — I10 Essential (primary) hypertension: Secondary | ICD-10-CM | POA: Diagnosis not present

## 2018-08-02 DIAGNOSIS — F5221 Male erectile disorder: Secondary | ICD-10-CM | POA: Diagnosis not present

## 2018-08-02 DIAGNOSIS — D519 Vitamin B12 deficiency anemia, unspecified: Secondary | ICD-10-CM | POA: Diagnosis not present

## 2018-08-05 DIAGNOSIS — F5221 Male erectile disorder: Secondary | ICD-10-CM | POA: Diagnosis not present

## 2018-08-05 DIAGNOSIS — I10 Essential (primary) hypertension: Secondary | ICD-10-CM | POA: Diagnosis not present

## 2018-08-05 DIAGNOSIS — E538 Deficiency of other specified B group vitamins: Secondary | ICD-10-CM | POA: Diagnosis not present

## 2018-08-05 DIAGNOSIS — Z0001 Encounter for general adult medical examination with abnormal findings: Secondary | ICD-10-CM | POA: Diagnosis not present

## 2018-08-05 DIAGNOSIS — Z6821 Body mass index (BMI) 21.0-21.9, adult: Secondary | ICD-10-CM | POA: Diagnosis not present

## 2018-08-05 DIAGNOSIS — F17210 Nicotine dependence, cigarettes, uncomplicated: Secondary | ICD-10-CM | POA: Diagnosis not present

## 2018-08-05 DIAGNOSIS — Z23 Encounter for immunization: Secondary | ICD-10-CM | POA: Diagnosis not present

## 2018-09-25 DIAGNOSIS — M5431 Sciatica, right side: Secondary | ICD-10-CM | POA: Diagnosis not present

## 2018-09-25 DIAGNOSIS — Z6821 Body mass index (BMI) 21.0-21.9, adult: Secondary | ICD-10-CM | POA: Diagnosis not present

## 2018-12-05 DIAGNOSIS — F5221 Male erectile disorder: Secondary | ICD-10-CM | POA: Diagnosis not present

## 2018-12-05 DIAGNOSIS — Z1389 Encounter for screening for other disorder: Secondary | ICD-10-CM | POA: Diagnosis not present

## 2018-12-05 DIAGNOSIS — E538 Deficiency of other specified B group vitamins: Secondary | ICD-10-CM | POA: Diagnosis not present

## 2018-12-05 DIAGNOSIS — Z6821 Body mass index (BMI) 21.0-21.9, adult: Secondary | ICD-10-CM | POA: Diagnosis not present

## 2018-12-05 DIAGNOSIS — F17210 Nicotine dependence, cigarettes, uncomplicated: Secondary | ICD-10-CM | POA: Diagnosis not present

## 2018-12-05 DIAGNOSIS — F331 Major depressive disorder, recurrent, moderate: Secondary | ICD-10-CM | POA: Diagnosis not present

## 2018-12-05 DIAGNOSIS — Z1331 Encounter for screening for depression: Secondary | ICD-10-CM | POA: Diagnosis not present

## 2018-12-05 DIAGNOSIS — I10 Essential (primary) hypertension: Secondary | ICD-10-CM | POA: Diagnosis not present

## 2019-01-04 DIAGNOSIS — R079 Chest pain, unspecified: Secondary | ICD-10-CM | POA: Diagnosis not present

## 2019-01-04 DIAGNOSIS — R0602 Shortness of breath: Secondary | ICD-10-CM | POA: Diagnosis not present

## 2019-01-04 DIAGNOSIS — Z6821 Body mass index (BMI) 21.0-21.9, adult: Secondary | ICD-10-CM | POA: Diagnosis not present

## 2019-02-07 DIAGNOSIS — F331 Major depressive disorder, recurrent, moderate: Secondary | ICD-10-CM | POA: Diagnosis not present

## 2019-02-07 DIAGNOSIS — F1010 Alcohol abuse, uncomplicated: Secondary | ICD-10-CM | POA: Diagnosis not present

## 2019-02-07 DIAGNOSIS — I10 Essential (primary) hypertension: Secondary | ICD-10-CM | POA: Diagnosis not present

## 2019-02-07 DIAGNOSIS — Z1331 Encounter for screening for depression: Secondary | ICD-10-CM | POA: Diagnosis not present

## 2019-04-03 DIAGNOSIS — I10 Essential (primary) hypertension: Secondary | ICD-10-CM | POA: Diagnosis not present

## 2019-04-03 DIAGNOSIS — Z9189 Other specified personal risk factors, not elsewhere classified: Secondary | ICD-10-CM | POA: Diagnosis not present

## 2019-04-03 DIAGNOSIS — E876 Hypokalemia: Secondary | ICD-10-CM | POA: Diagnosis not present

## 2019-04-03 DIAGNOSIS — F17210 Nicotine dependence, cigarettes, uncomplicated: Secondary | ICD-10-CM | POA: Diagnosis not present

## 2019-04-15 DIAGNOSIS — F331 Major depressive disorder, recurrent, moderate: Secondary | ICD-10-CM | POA: Diagnosis not present

## 2019-04-15 DIAGNOSIS — I10 Essential (primary) hypertension: Secondary | ICD-10-CM | POA: Diagnosis not present

## 2019-04-15 DIAGNOSIS — F5221 Male erectile disorder: Secondary | ICD-10-CM | POA: Diagnosis not present

## 2019-04-15 DIAGNOSIS — F17210 Nicotine dependence, cigarettes, uncomplicated: Secondary | ICD-10-CM | POA: Diagnosis not present

## 2019-04-15 DIAGNOSIS — Z6820 Body mass index (BMI) 20.0-20.9, adult: Secondary | ICD-10-CM | POA: Diagnosis not present

## 2019-07-08 DIAGNOSIS — M25561 Pain in right knee: Secondary | ICD-10-CM | POA: Diagnosis not present

## 2019-07-08 DIAGNOSIS — Z6820 Body mass index (BMI) 20.0-20.9, adult: Secondary | ICD-10-CM | POA: Diagnosis not present

## 2019-08-18 DIAGNOSIS — F331 Major depressive disorder, recurrent, moderate: Secondary | ICD-10-CM | POA: Diagnosis not present

## 2019-10-30 DIAGNOSIS — F5221 Male erectile disorder: Secondary | ICD-10-CM | POA: Diagnosis not present

## 2019-10-30 DIAGNOSIS — F331 Major depressive disorder, recurrent, moderate: Secondary | ICD-10-CM | POA: Diagnosis not present

## 2019-10-30 DIAGNOSIS — Z6820 Body mass index (BMI) 20.0-20.9, adult: Secondary | ICD-10-CM | POA: Diagnosis not present

## 2019-10-30 DIAGNOSIS — F17210 Nicotine dependence, cigarettes, uncomplicated: Secondary | ICD-10-CM | POA: Diagnosis not present

## 2019-10-30 DIAGNOSIS — I10 Essential (primary) hypertension: Secondary | ICD-10-CM | POA: Diagnosis not present

## 2019-12-01 DIAGNOSIS — M26629 Arthralgia of temporomandibular joint, unspecified side: Secondary | ICD-10-CM | POA: Diagnosis not present

## 2020-01-20 DIAGNOSIS — S20219A Contusion of unspecified front wall of thorax, initial encounter: Secondary | ICD-10-CM | POA: Diagnosis not present

## 2020-01-20 DIAGNOSIS — Z6820 Body mass index (BMI) 20.0-20.9, adult: Secondary | ICD-10-CM | POA: Diagnosis not present

## 2020-01-20 DIAGNOSIS — R0789 Other chest pain: Secondary | ICD-10-CM | POA: Diagnosis not present

## 2020-01-31 IMAGING — DX DG WRIST COMPLETE 3+V*L*
4 series · 4 of 4 positions shown · non-contrast
Comparison: 12/09/2003

CLINICAL DATA: Pain without trauma.

EXAM:
LEFT WRIST - COMPLETE 3+ VIEW

[wrist pa]
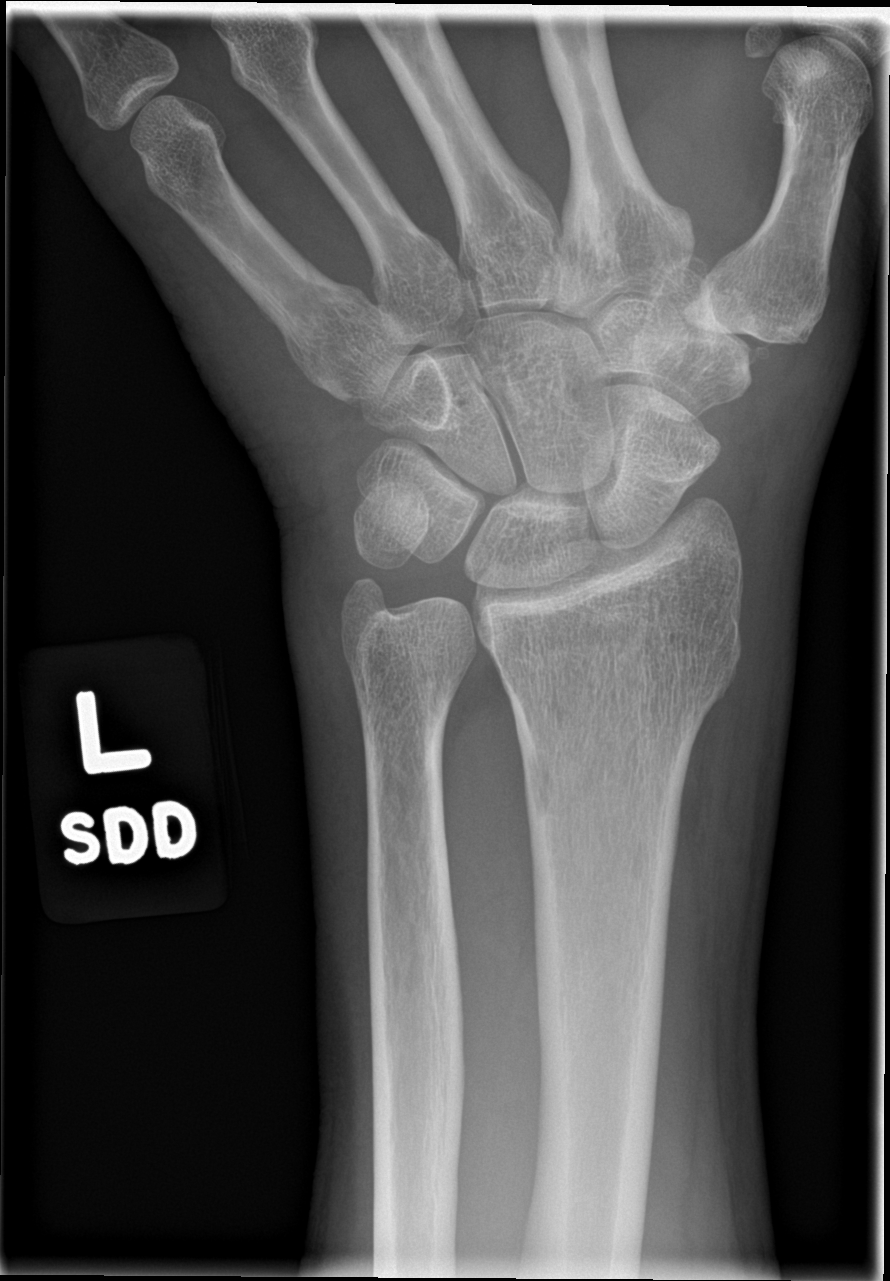

[wrist obl]
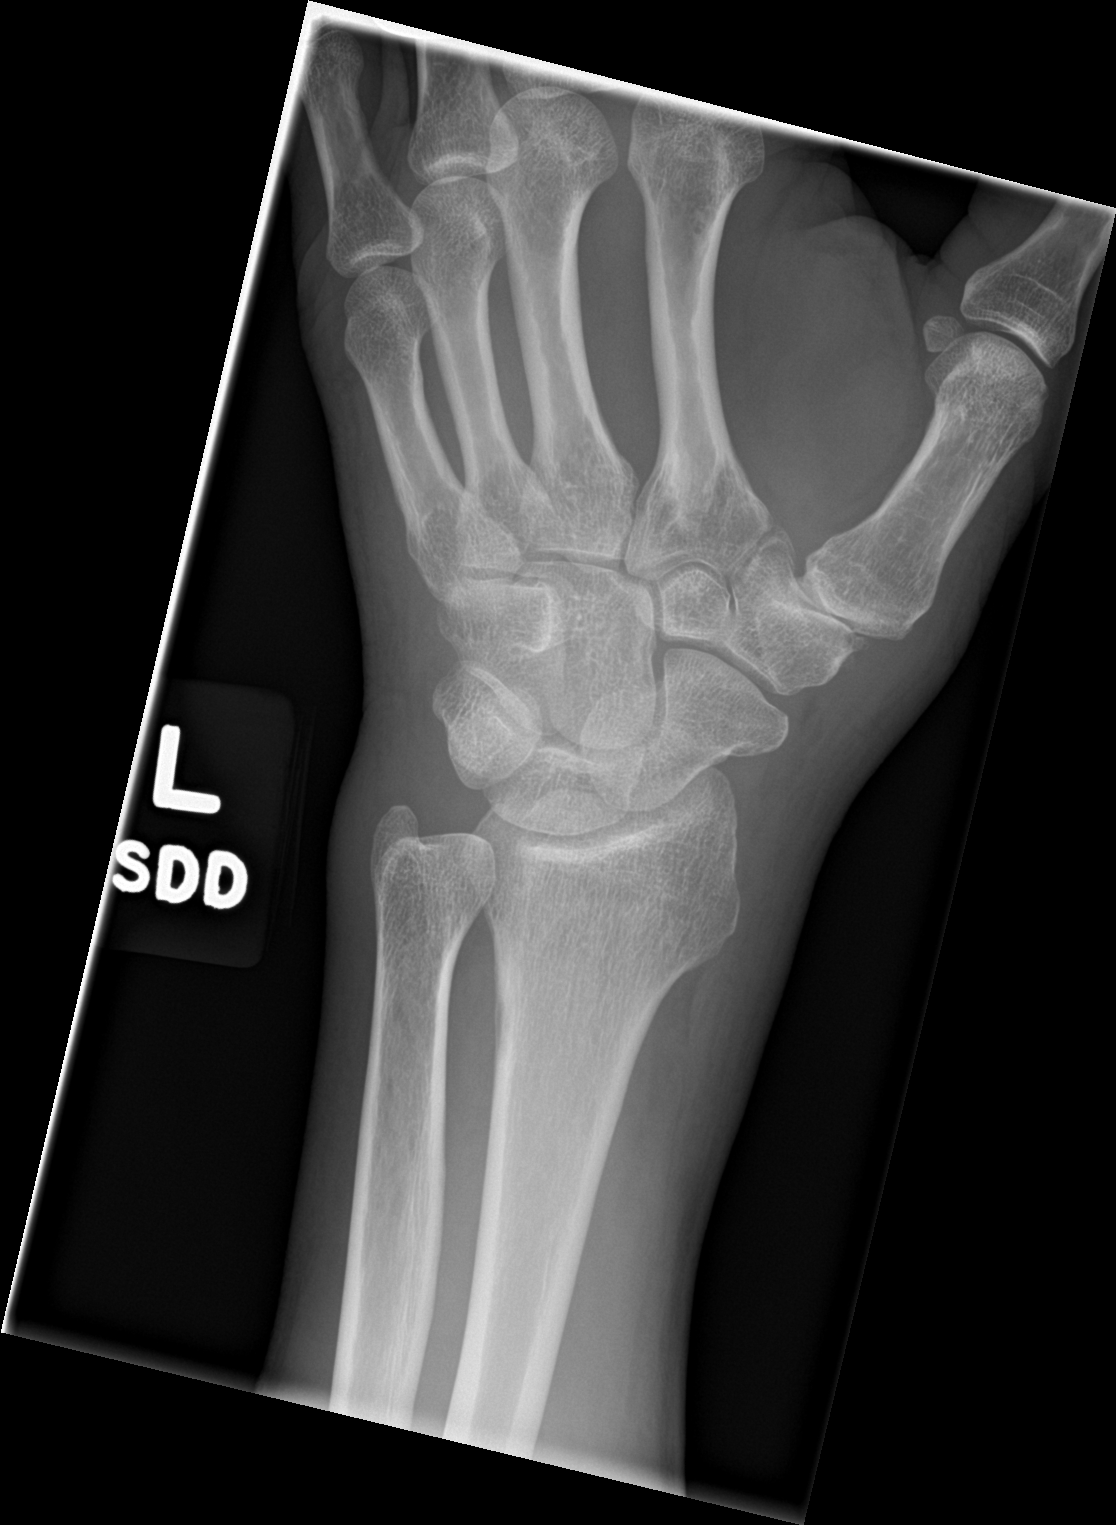

[wrist lat]
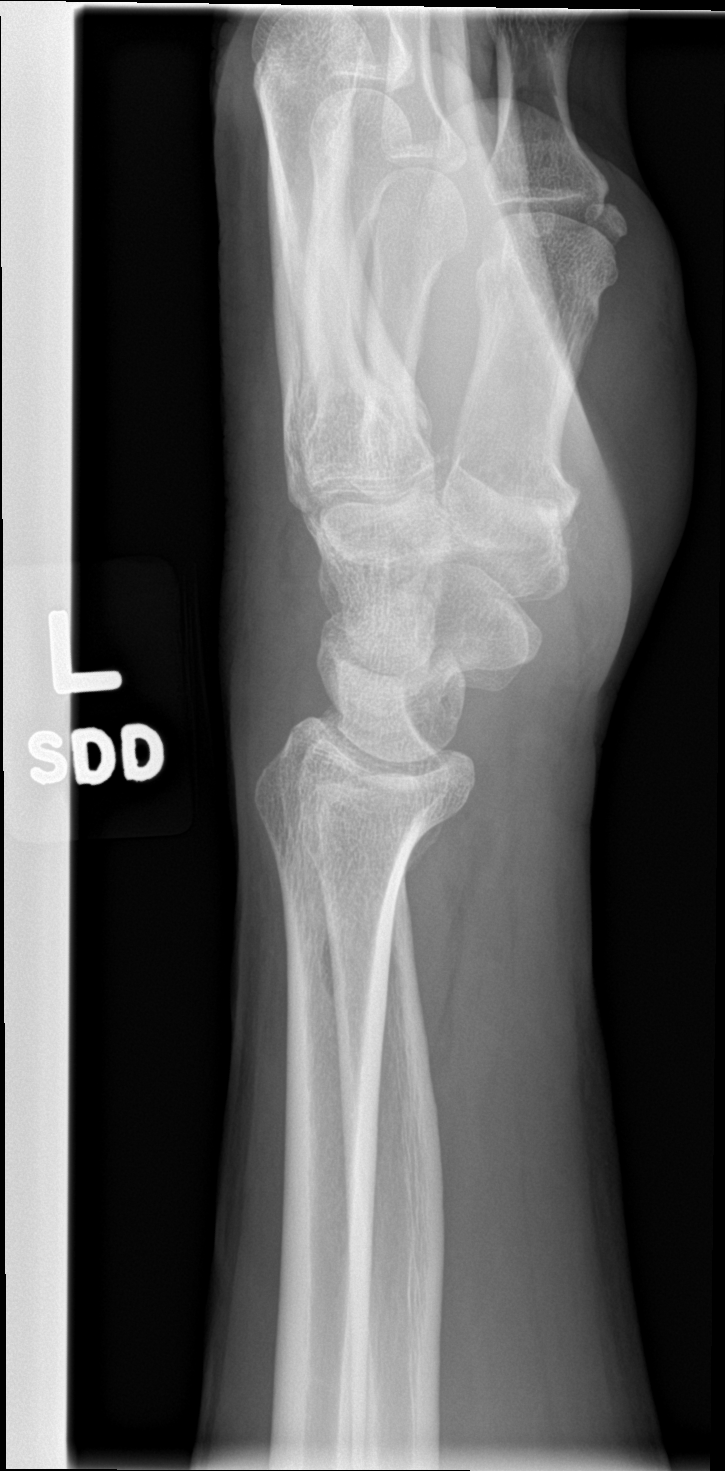

[wrist navicular]
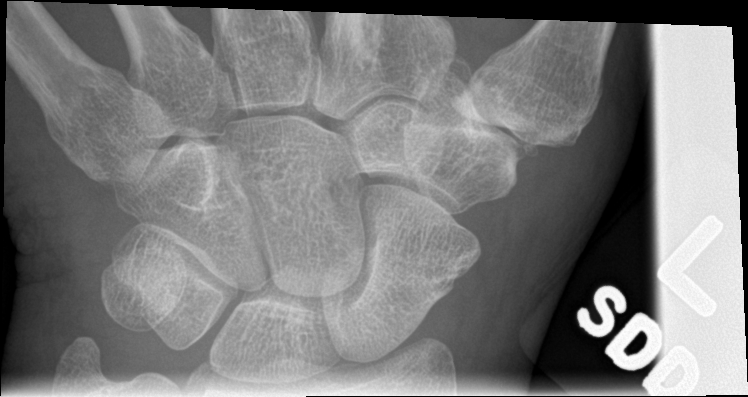

[4 of 4 positions shown; findings below may reference images not displayed]

FINDINGS: No acute fracture or dislocation. Scaphoid intact. Joint spaces are
maintained for age.
IMPRESSION: No acute osseous abnormality.

## 2021-01-06 ENCOUNTER — Other Ambulatory Visit: Payer: Self-pay

## 2021-01-06 ENCOUNTER — Ambulatory Visit (INDEPENDENT_AMBULATORY_CARE_PROVIDER_SITE_OTHER): Payer: BC Managed Care – PPO | Admitting: Psychiatry

## 2021-01-06 ENCOUNTER — Encounter (HOSPITAL_COMMUNITY): Payer: Self-pay | Admitting: Psychiatry

## 2021-01-06 DIAGNOSIS — F320 Major depressive disorder, single episode, mild: Secondary | ICD-10-CM | POA: Diagnosis not present
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
## 2021-01-19 ENCOUNTER — Ambulatory Visit (HOSPITAL_COMMUNITY): Payer: Self-pay | Admitting: Psychiatry

## 2021-02-08 ENCOUNTER — Ambulatory Visit (HOSPITAL_COMMUNITY): Payer: Self-pay | Admitting: Psychiatry

## 2021-02-28 ENCOUNTER — Encounter (HOSPITAL_COMMUNITY): Payer: Self-pay

## 2021-02-28 ENCOUNTER — Other Ambulatory Visit: Payer: Self-pay

## 2021-02-28 ENCOUNTER — Ambulatory Visit (INDEPENDENT_AMBULATORY_CARE_PROVIDER_SITE_OTHER): Payer: BC Managed Care – PPO | Admitting: Psychiatry

## 2021-02-28 DIAGNOSIS — F320 Major depressive disorder, single episode, mild: Secondary | ICD-10-CM | POA: Diagnosis not present

## 2021-02-28 NOTE — Plan of Care (Signed)
Pt participated in development of plan 
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
## 2021-03-23 ENCOUNTER — Other Ambulatory Visit: Payer: Self-pay

## 2021-03-23 ENCOUNTER — Ambulatory Visit (INDEPENDENT_AMBULATORY_CARE_PROVIDER_SITE_OTHER): Payer: BC Managed Care – PPO | Admitting: Psychiatry

## 2021-03-23 DIAGNOSIS — F320 Major depressive disorder, single episode, mild: Secondary | ICD-10-CM
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
## 2021-04-06 ENCOUNTER — Other Ambulatory Visit: Payer: Self-pay

## 2021-04-06 ENCOUNTER — Ambulatory Visit (INDEPENDENT_AMBULATORY_CARE_PROVIDER_SITE_OTHER): Payer: BC Managed Care – PPO | Admitting: Psychiatry

## 2021-04-06 DIAGNOSIS — F320 Major depressive disorder, single episode, mild: Secondary | ICD-10-CM | POA: Diagnosis not present
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
## 2021-04-20 ENCOUNTER — Ambulatory Visit (INDEPENDENT_AMBULATORY_CARE_PROVIDER_SITE_OTHER): Payer: BC Managed Care – PPO | Admitting: Psychiatry

## 2021-04-20 ENCOUNTER — Other Ambulatory Visit: Payer: Self-pay

## 2021-04-20 DIAGNOSIS — F320 Major depressive disorder, single episode, mild: Secondary | ICD-10-CM | POA: Diagnosis not present
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
## 2021-05-04 ENCOUNTER — Ambulatory Visit (HOSPITAL_COMMUNITY): Payer: BC Managed Care – PPO | Admitting: Psychiatry

## 2021-05-18 ENCOUNTER — Ambulatory Visit (INDEPENDENT_AMBULATORY_CARE_PROVIDER_SITE_OTHER): Payer: BC Managed Care – PPO | Admitting: Psychiatry

## 2021-05-18 ENCOUNTER — Other Ambulatory Visit: Payer: Self-pay

## 2021-05-18 DIAGNOSIS — F320 Major depressive disorder, single episode, mild: Secondary | ICD-10-CM
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
## 2021-06-01 ENCOUNTER — Other Ambulatory Visit: Payer: Self-pay

## 2021-06-01 ENCOUNTER — Ambulatory Visit (HOSPITAL_COMMUNITY): Payer: BC Managed Care – PPO | Admitting: Psychiatry

## 2021-06-15 ENCOUNTER — Ambulatory Visit (HOSPITAL_COMMUNITY): Payer: BC Managed Care – PPO | Admitting: Psychiatry

## 2021-06-15 ENCOUNTER — Other Ambulatory Visit: Payer: Self-pay

## 2021-06-29 ENCOUNTER — Ambulatory Visit (HOSPITAL_COMMUNITY): Payer: BC Managed Care – PPO | Admitting: Psychiatry

## 2021-06-29 ENCOUNTER — Other Ambulatory Visit: Payer: Self-pay

## 2021-07-14 ENCOUNTER — Ambulatory Visit (INDEPENDENT_AMBULATORY_CARE_PROVIDER_SITE_OTHER): Payer: BC Managed Care – PPO | Admitting: Psychiatry

## 2021-07-14 DIAGNOSIS — F320 Major depressive disorder, single episode, mild: Secondary | ICD-10-CM
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
## 2021-08-09 ENCOUNTER — Ambulatory Visit (INDEPENDENT_AMBULATORY_CARE_PROVIDER_SITE_OTHER): Payer: BC Managed Care – PPO | Admitting: Psychiatry

## 2021-08-09 DIAGNOSIS — F320 Major depressive disorder, single episode, mild: Secondary | ICD-10-CM | POA: Diagnosis not present
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
## 2021-08-24 ENCOUNTER — Ambulatory Visit (HOSPITAL_COMMUNITY): Payer: BC Managed Care – PPO | Admitting: Psychiatry

## 2021-09-07 ENCOUNTER — Ambulatory Visit (HOSPITAL_COMMUNITY): Payer: BC Managed Care – PPO | Admitting: Psychiatry

## 2021-09-13 ENCOUNTER — Ambulatory Visit (HOSPITAL_COMMUNITY): Payer: BC Managed Care – PPO | Admitting: Psychiatry

## 2021-09-21 ENCOUNTER — Ambulatory Visit (HOSPITAL_COMMUNITY): Payer: BC Managed Care – PPO | Admitting: Psychiatry

## 2023-03-10 DIAGNOSIS — M546 Pain in thoracic spine: Secondary | ICD-10-CM | POA: Diagnosis not present

## 2023-03-10 DIAGNOSIS — M25511 Pain in right shoulder: Secondary | ICD-10-CM | POA: Diagnosis not present

## 2023-03-10 DIAGNOSIS — S2241XA Multiple fractures of ribs, right side, initial encounter for closed fracture: Secondary | ICD-10-CM | POA: Diagnosis not present

## 2023-03-10 DIAGNOSIS — Y92003 Bedroom of unspecified non-institutional (private) residence as the place of occurrence of the external cause: Secondary | ICD-10-CM | POA: Diagnosis not present

## 2023-03-10 DIAGNOSIS — S299XXA Unspecified injury of thorax, initial encounter: Secondary | ICD-10-CM | POA: Diagnosis present

## 2023-03-10 DIAGNOSIS — W01198A Fall on same level from slipping, tripping and stumbling with subsequent striking against other object, initial encounter: Secondary | ICD-10-CM | POA: Insufficient documentation

## 2023-03-11 ENCOUNTER — Emergency Department (HOSPITAL_COMMUNITY): Payer: BC Managed Care – PPO

## 2023-03-11 ENCOUNTER — Emergency Department (HOSPITAL_COMMUNITY)
Admission: EM | Admit: 2023-03-11 | Discharge: 2023-03-11 | Disposition: A | Discharge status: Home / Self Care | Payer: BC Managed Care – PPO | Attending: Emergency Medicine | Admitting: Emergency Medicine

## 2023-03-11 ENCOUNTER — Other Ambulatory Visit: Payer: Self-pay

## 2023-03-11 ENCOUNTER — Encounter (HOSPITAL_COMMUNITY): Payer: Self-pay

## 2023-03-11 DIAGNOSIS — W19XXXA Unspecified fall, initial encounter: Secondary | ICD-10-CM

## 2023-03-11 DIAGNOSIS — S2241XA Multiple fractures of ribs, right side, initial encounter for closed fracture: Secondary | ICD-10-CM

## 2023-03-11 MED ORDER — OXYCODONE-ACETAMINOPHEN 5-325 MG PO TABS
1.0000 | ORAL_TABLET | Freq: Once | ORAL | Status: AC
  Administered 2023-03-11: 1 via ORAL
  Filled 2023-03-11: qty 1

## 2023-03-11 MED ORDER — OXYCODONE-ACETAMINOPHEN 5-325 MG PO TABS: 1.0000 | ORAL_TABLET | Freq: Four times a day (QID) | ORAL | 0 refills | Status: DC | PRN

## 2023-03-11 MED ORDER — IBUPROFEN 400 MG PO TABS
600.0000 mg | ORAL_TABLET | Freq: Once | ORAL | Status: AC
  Administered 2023-03-11: 600 mg via ORAL
  Filled 2023-03-11: qty 2

## 2023-03-11 MED ORDER — IBUPROFEN 800 MG PO TABS: 800.0000 mg | ORAL_TABLET | Freq: Three times a day (TID) | ORAL | 0 refills | Status: DC

## 2023-03-11 NOTE — ED Provider Notes (Signed)
[place] EMERGENCY DEPARTMENT AT [facility]  Provider Note  CSN: [id number] Arrival date & time: 03/10/23 2350  History Chief Complaint  Patient presents with   Shoulder Injury    Jeff Anderson is a 57 y.o. male with no significant PMH brought by wife for evaluation of R upper back/shoulder pain. He reports he slipped and fell in his bedroom, hitting his R scapular area on the bedpost. He is having significant pain in that area, worse with movement and deep breath.    Home Medications Prior to Admission medications   Medication Sig Start Date End Date Taking? Authorizing Provider  ibuprofen (ADVIL) 800 MG tablet Take 1 tablet (800 mg total) by mouth 3 (three) times daily. 03/11/23  Yes Pollyann Savoy, MD  oxyCODONE-acetaminophen (PERCOCET/ROXICET) 5-325 MG tablet Take 1-2 tablets by mouth every 6 (six) hours as needed for severe pain (pain score 7-10). 03/11/23   Pollyann Savoy, MD     Allergies    Codeine   Review of Systems   Review of Systems Please see HPI for pertinent positives and negatives  Physical Exam BP 101/72 (BP Location: Right Arm)   Pulse 88   Temp 98 F (36.7 C) (Oral)   Resp 18   Ht 5\' 11"  (1.803 m)   Wt 68.9 kg   SpO2 99%   BMI 21.20 kg/m   Physical Exam Vitals and nursing note reviewed.  Constitutional:      Appearance: Normal appearance.  HENT:     Head: Normocephalic and atraumatic.     Nose: Nose normal.     Mouth/Throat:     Mouth: Mucous membranes are moist.  Eyes:     Extraocular Movements: Extraocular movements intact.     Conjunctiva/sclera: Conjunctivae normal.  Cardiovascular:     Rate and Rhythm: Normal rate.  Pulmonary:     Effort: Pulmonary effort is normal.     Breath sounds: Normal breath sounds.  Chest:     Chest wall: Tenderness (R posterolateral ribs) present.  Abdominal:     General: Abdomen is flat.     Palpations: Abdomen is soft.     Tenderness: There is no abdominal tenderness.   Musculoskeletal:        General: No swelling. Normal range of motion.     Cervical back: Neck supple.  Skin:    General: Skin is warm and dry.  Neurological:     General: No focal deficit present.     Mental Status: He is alert.  Psychiatric:        Mood and Affect: Mood normal.     ED Results / Procedures / Treatments   EKG None  Procedures Procedures  Medications Ordered in the ED Medications  oxyCODONE-acetaminophen (PERCOCET/ROXICET) 5-325 MG per tablet 1 tablet (1 tablet Oral Given 03/11/23 0051)  ibuprofen (ADVIL) tablet 600 mg (600 mg Oral Given 03/11/23 0300)  oxyCODONE-acetaminophen (PERCOCET/ROXICET) 5-325 MG per tablet 1 tablet (1 tablet Oral Given 03/11/23 0300)    Initial Impression and Plan  Patient here with mechanical fall and R rib pain. I personally viewed the images from radiology studies and agree with radiologist interpretation: Xrays show normal shoulder but 4th-6th rib fractures. Patient reports pain improved some with percocet given prior to my evaluation.   ED Course   Clinical Course as of 03/11/23 0435  Wynelle Link Mar 11, 2023  5621 Patient reports pain is improved and he would like to go home. Requesting additional dose of pain medications  to help him through the night. Rx for Percocet, motrin sent. Rib fx instructions given. PCP follow up, RTED for any other concerns.   [CS]    Clinical Course User Index [CS] Pollyann Savoy, MD     MDM Rules/Calculators/A&P Medical Decision Making Problems Addressed: Closed fracture of multiple ribs of right side, initial encounter: acute illness or injury Fall, initial encounter: acute illness or injury  Amount and/or Complexity of Data Reviewed Radiology: ordered and independent interpretation performed. Decision-making details documented in ED Course.  Risk Prescription drug management.     Final Clinical Impression(s) / ED Diagnoses Final diagnoses:  Fall, initial encounter  Closed fracture of  multiple ribs of right side, initial encounter    Rx / DC Orders ED Discharge Orders          Ordered    ibuprofen (ADVIL) 800 MG tablet  3 times daily        03/11/23 0255    oxyCODONE-acetaminophen (PERCOCET/ROXICET) 5-325 MG tablet  Every 6 hours PRN,   Status:  Discontinued        03/11/23 0255    oxyCODONE-acetaminophen (PERCOCET/ROXICET) 5-325 MG tablet  Every 6 hours PRN        03/11/23 0255             Pollyann Savoy, MD 03/11/23 [phone]

## 2023-03-11 NOTE — ED Triage Notes (Signed)
Pt arrived via POV c/o right shoulder injury following a fall in his bedroom. Pt reports ribcage pain and limited movement in RUE. Pt denies LOC.

## 2023-03-11 NOTE — ED Notes (Signed)
Pt returned from xray, on stretcher in position of comfort with call bell in reach.

## 2023-03-18 ENCOUNTER — Encounter (HOSPITAL_COMMUNITY): Payer: Self-pay

## 2023-03-18 ENCOUNTER — Other Ambulatory Visit: Payer: Self-pay

## 2023-03-18 ENCOUNTER — Inpatient Hospital Stay (HOSPITAL_COMMUNITY)
Admission: EM | Admit: 2023-03-18 | Discharge: 2023-03-20 | DRG: 392 | Disposition: A | Discharge status: Home / Self Care | Payer: Self-pay | Attending: Internal Medicine | Admitting: Internal Medicine

## 2023-03-18 ENCOUNTER — Emergency Department (HOSPITAL_COMMUNITY): Payer: Self-pay

## 2023-03-18 DIAGNOSIS — R9431 Abnormal electrocardiogram [ECG] [EKG]: Secondary | ICD-10-CM | POA: Diagnosis not present

## 2023-03-18 DIAGNOSIS — S2241XA Multiple fractures of ribs, right side, initial encounter for closed fracture: Secondary | ICD-10-CM | POA: Insufficient documentation

## 2023-03-18 DIAGNOSIS — K5900 Constipation, unspecified: Secondary | ICD-10-CM | POA: Diagnosis not present

## 2023-03-18 DIAGNOSIS — E876 Hypokalemia: Secondary | ICD-10-CM | POA: Insufficient documentation

## 2023-03-18 DIAGNOSIS — D539 Nutritional anemia, unspecified: Secondary | ICD-10-CM | POA: Diagnosis present

## 2023-03-18 DIAGNOSIS — Z79899 Other long term (current) drug therapy: Secondary | ICD-10-CM

## 2023-03-18 DIAGNOSIS — F17210 Nicotine dependence, cigarettes, uncomplicated: Secondary | ICD-10-CM | POA: Diagnosis present

## 2023-03-18 DIAGNOSIS — E878 Other disorders of electrolyte and fluid balance, not elsewhere classified: Secondary | ICD-10-CM | POA: Diagnosis present

## 2023-03-18 DIAGNOSIS — E871 Hypo-osmolality and hyponatremia: Secondary | ICD-10-CM | POA: Diagnosis present

## 2023-03-18 DIAGNOSIS — R112 Nausea with vomiting, unspecified: Principal | ICD-10-CM | POA: Diagnosis present

## 2023-03-18 DIAGNOSIS — Z91128 Patient's intentional underdosing of medication regimen for other reason: Secondary | ICD-10-CM

## 2023-03-18 DIAGNOSIS — W19XXXD Unspecified fall, subsequent encounter: Secondary | ICD-10-CM | POA: Diagnosis present

## 2023-03-18 DIAGNOSIS — D649 Anemia, unspecified: Secondary | ICD-10-CM | POA: Insufficient documentation

## 2023-03-18 DIAGNOSIS — K529 Noninfective gastroenteritis and colitis, unspecified: Principal | ICD-10-CM | POA: Insufficient documentation

## 2023-03-18 DIAGNOSIS — K219 Gastro-esophageal reflux disease without esophagitis: Secondary | ICD-10-CM | POA: Diagnosis present

## 2023-03-18 DIAGNOSIS — Z885 Allergy status to narcotic agent status: Secondary | ICD-10-CM

## 2023-03-18 DIAGNOSIS — E860 Dehydration: Secondary | ICD-10-CM | POA: Diagnosis present

## 2023-03-18 DIAGNOSIS — S2241XD Multiple fractures of ribs, right side, subsequent encounter for fracture with routine healing: Secondary | ICD-10-CM

## 2023-03-18 LAB — MAGNESIUM: Magnesium: 1.7 mg/dL (ref 1.7–2.4)

## 2023-03-18 LAB — COMPREHENSIVE METABOLIC PANEL
ALT: 18 U/L (ref 0–44)
AST: 23 U/L (ref 15–41)
Albumin: 3 g/dL — ABNORMAL LOW (ref 3.5–5.0)
Alkaline Phosphatase: 70 U/L (ref 38–126)
Anion gap: 12 (ref 5–15)
BUN: 8 mg/dL (ref 6–20)
CO2: 34 mmol/L — ABNORMAL HIGH (ref 22–32)
Calcium: 8.6 mg/dL — ABNORMAL LOW (ref 8.9–10.3)
Chloride: 87 mmol/L — ABNORMAL LOW (ref 98–111)
Creatinine, Ser: 0.62 mg/dL (ref 0.61–1.24)
GFR, Estimated: >60 mL/min (ref >60)
Glucose, Bld: 103 mg/dL — ABNORMAL HIGH (ref 70–99)
Potassium: 2.2 mmol/L — CL (ref 3.5–5.1)
Sodium: 133 mmol/L — ABNORMAL LOW (ref 135–145)
Total Bilirubin: 1.2 mg/dL — ABNORMAL HIGH (ref <1.2)
Total Protein: 6.4 g/dL — ABNORMAL LOW (ref 6.5–8.1)

## 2023-03-18 LAB — CBC WITH DIFFERENTIAL/PLATELET
Abs Immature Granulocytes: 0.03 10*3/uL (ref 0.00–0.07)
Basophils Absolute: 0 10*3/uL (ref 0.0–0.1)
Basophils Relative: 0 %
Eosinophils Absolute: 0 10*3/uL (ref 0.0–0.5)
Eosinophils Relative: 1 %
HCT: 29.4 % — ABNORMAL LOW (ref 39.0–52.0)
Hemoglobin: 11.5 g/dL — ABNORMAL LOW (ref 13.0–17.0)
Immature Granulocytes: 1 %
Lymphocytes Relative: 18 %
Lymphs Abs: 0.8 10*3/uL (ref 0.7–4.0)
MCH: 42.9 pg — ABNORMAL HIGH (ref 26.0–34.0)
MCHC: 39.1 g/dL — ABNORMAL HIGH (ref 30.0–36.0)
MCV: 109.7 fL — ABNORMAL HIGH (ref 80.0–100.0)
Monocytes Absolute: 0.7 10*3/uL (ref 0.1–1.0)
Monocytes Relative: 16 %
Neutro Abs: 2.8 10*3/uL (ref 1.7–7.7)
Neutrophils Relative %: 64 %
Platelets: 205 10*3/uL (ref 150–400)
RBC: 2.68 MIL/uL — ABNORMAL LOW (ref 4.22–5.81)
RDW: 15.2 % (ref 11.5–15.5)
WBC: 4.3 10*3/uL (ref 4.0–10.5)
nRBC: 0 % (ref 0.0–0.2)

## 2023-03-18 LAB — LIPASE, BLOOD: Lipase: 39 U/L (ref 11–51)

## 2023-03-18 LAB — URINALYSIS, ROUTINE W REFLEX MICROSCOPIC
Bilirubin Urine: NEGATIVE
Glucose, UA: NEGATIVE mg/dL
Hgb urine dipstick: NEGATIVE
Ketones, ur: NEGATIVE mg/dL
Leukocytes,Ua: NEGATIVE
Nitrite: NEGATIVE
Protein, ur: NEGATIVE mg/dL
Specific Gravity, Urine: 1.01 (ref 1.005–1.030)
pH: 8 (ref 5.0–8.0)

## 2023-03-18 LAB — ACETAMINOPHEN LEVEL: Acetaminophen (Tylenol), Serum: <10 ug/mL — ABNORMAL LOW (ref 10–30)

## 2023-03-18 MED ORDER — LACTATED RINGERS IV BOLUS
1000.0000 mL | Freq: Once | INTRAVENOUS | Status: AC
  Administered 2023-03-18: 1000 mL via INTRAVENOUS

## 2023-03-18 MED ORDER — IOHEXOL 300 MG/ML  SOLN
100.0000 mL | Freq: Once | INTRAMUSCULAR | Status: AC | PRN
  Administered 2023-03-18: 100 mL via INTRAVENOUS

## 2023-03-18 MED ORDER — ENOXAPARIN SODIUM 40 MG/0.4ML IJ SOSY
40.0000 mg | PREFILLED_SYRINGE | INTRAMUSCULAR | Status: DC
  Administered 2023-03-18 – 2023-03-19 (×2): 40 mg via SUBCUTANEOUS
  Filled 2023-03-18 (×2): qty 0.4

## 2023-03-18 MED ORDER — POLYETHYLENE GLYCOL 3350 17 G PO PACK: 17.0000 g | PACK | Freq: Every day | ORAL | Status: DC

## 2023-03-18 MED ORDER — DOCUSATE SODIUM 100 MG PO CAPS
100.0000 mg | ORAL_CAPSULE | Freq: Every day | ORAL | Status: DC
  Administered 2023-03-18 – 2023-03-19 (×2): 100 mg via ORAL
  Filled 2023-03-18 (×2): qty 1

## 2023-03-18 MED ORDER — ACETAMINOPHEN 325 MG PO TABS: 650.0000 mg | ORAL_TABLET | Freq: Four times a day (QID) | ORAL | Status: DC | PRN

## 2023-03-18 MED ORDER — PROCHLORPERAZINE EDISYLATE 10 MG/2ML IJ SOLN
10.0000 mg | Freq: Four times a day (QID) | INTRAMUSCULAR | Status: DC | PRN
  Administered 2023-03-19 – 2023-03-20 (×2): 10 mg via INTRAVENOUS
  Filled 2023-03-18 (×2): qty 2

## 2023-03-18 MED ORDER — ONDANSETRON HCL 4 MG/2ML IJ SOLN
4.0000 mg | Freq: Once | INTRAMUSCULAR | Status: AC
Start: 2023-03-18 — End: 2023-03-18
  Administered 2023-03-18: 4 mg via INTRAVENOUS
  Filled 2023-03-18: qty 2

## 2023-03-18 MED ORDER — POTASSIUM CHLORIDE 10 MEQ/100ML IV SOLN
10.0000 meq | INTRAVENOUS | Status: AC
  Administered 2023-03-18 (×4): 10 meq via INTRAVENOUS
  Filled 2023-03-18 (×4): qty 100

## 2023-03-18 MED ORDER — CHLORHEXIDINE GLUCONATE CLOTH 2 % EX PADS
6.0000 | MEDICATED_PAD | Freq: Every day | CUTANEOUS | Status: DC
  Administered 2023-03-19 – 2023-03-20 (×2): 6 via TOPICAL

## 2023-03-18 MED ORDER — LACTATED RINGERS IV SOLN: INTRAVENOUS | Status: AC

## 2023-03-18 MED ORDER — POTASSIUM CHLORIDE CRYS ER 20 MEQ PO TBCR
40.0000 meq | EXTENDED_RELEASE_TABLET | Freq: Once | ORAL | Status: AC
  Administered 2023-03-18: 40 meq via ORAL
  Filled 2023-03-18: qty 2

## 2023-03-18 NOTE — ED Triage Notes (Signed)
Pt reports he has been nauseous and vomiting since breaking his ribs so he stopped the meds and is still having N/V and reports no BM x 3 days.

## 2023-03-18 NOTE — ED Provider Notes (Signed)
[place] EMERGENCY DEPARTMENT AT [facility] Provider Note   CSN: [id number] Arrival date & time: 03/18/23  1747     History {Add pertinent medical, surgical, social history, OB history to HPI:1} Chief Complaint  Patient presents with   Emesis    Jeff Anderson is a 57 y.o. male with PMH as listed below who presents with N/V.  Pt reports he has been nauseous and vomiting since breaking his ribs so he stopped the meds and is still having N/V and reports no BM x 3 days. Per chart review was seen in ED on 03/11/23 after falling in the bathroom and hitting his R scapular area. Found to have 4-6th rib fractures. Prescribed percocet at home. Since being at home has developed Nausea/vomiting and constipation since taking medications. LBM 3 days ago, ***is passing gas.    History reviewed. No pertinent past medical history.     Home Medications Prior to Admission medications   Medication Sig Start Date End Date Taking? Authorizing Provider  ibuprofen (ADVIL) 800 MG tablet Take 1 tablet (800 mg total) by mouth 3 (three) times daily. 03/11/23   Pollyann Savoy, MD  oxyCODONE-acetaminophen (PERCOCET/ROXICET) 5-325 MG tablet Take 1-2 tablets by mouth every 6 (six) hours as needed for severe pain (pain score 7-10). 03/11/23   Pollyann Savoy, MD      Allergies    Codeine    Review of Systems   Review of Systems A 10 point review of systems was performed and is negative unless otherwise reported in HPI.  Physical Exam Updated Vital Signs BP 111/74 (BP Location: Right Arm)   Pulse 95   Temp 98.1 F (36.7 C) (Oral)   Resp 16   Wt 68.9 kg   SpO2 99%   BMI 21.20 kg/m  Physical Exam General: Normal appearing {Desc; male/male:11659}, lying in bed.  HEENT: PERRLA, Sclera anicteric, MMM, trachea midline.  Cardiology: RRR, no murmurs/rubs/gallops. BL radial and DP pulses equal bilaterally.  Resp: Normal respiratory rate and effort. CTAB, no wheezes, rhonchi, crackles.   Abd: Soft, non-tender, non-distended. No rebound tenderness or guarding.  GU: Deferred. MSK: No peripheral edema or signs of trauma. Extremities without deformity or TTP. No cyanosis or clubbing. Skin: warm, dry. No rashes or lesions. Back: No CVA tenderness Neuro: A&Ox4, CNs II-XII grossly intact. MAEs. Sensation grossly intact.  Psych: Normal mood and affect.   ED Results / Procedures / Treatments   Labs (all labs ordered are listed, but only abnormal results are displayed) Labs Reviewed - No data to display  EKG None  Radiology No results found.  Procedures Procedures  {Document cardiac monitor, telemetry assessment procedure when appropriate:1}  Medications Ordered in ED Medications - No data to display  ED Course/ Medical Decision Making/ A&P                          Medical Decision Making   This patient presents to the ED for concern of ***, this involves an extensive number of treatment options, and is a complaint that carries with it a high risk of complications and morbidity.  I considered the following differential and admission for this acute, potentially life threatening condition.   MDM:    ***     Labs: I Ordered, and personally interpreted labs.  The pertinent results include:  ***  Imaging Studies ordered: I ordered imaging studies including *** I independently visualized and interpreted imaging. I agree with the  radiologist interpretation  Additional history obtained from ***.  External records from outside source obtained and reviewed including ***  Cardiac Monitoring: The patient was maintained on a cardiac monitor.  I personally viewed and interpreted the cardiac monitored which showed an underlying rhythm of: ***  Reevaluation: After the interventions noted above, I reevaluated the patient and found that they have :{resolved/improved/worsened:23923::"improved"}  Social Determinants of Health: ***  Disposition:  ***  Co morbidities  that complicate the patient evaluation History reviewed. No pertinent past medical history.   Medicines No orders of the defined types were placed in this encounter.   I have reviewed the patients home medicines and have made adjustments as needed  Problem List / ED Course: Problem List Items Addressed This Visit   None        {Document critical care time when appropriate:1} {Document review of labs and clinical decision tools ie heart score, Chads2Vasc2 etc:1}  {Document your independent review of radiology images, and any outside records:1} {Document your discussion with family members, caretakers, and with consultants:1} {Document social determinants of health affecting pt's care:1} {Document your decision making why or why not admission, treatments were needed:1}  This note was created using dictation software, which may contain spelling or grammatical errors.

## 2023-03-18 NOTE — ED Notes (Signed)
ED TO INPATIENT HANDOFF REPORT  ED Nurse Name and Phone #: Reese Stockman Medic [phone]  S Name/Age/Gender Jeff Anderson 57 y.o. male Room/Bed: APA10/APA10  Code Status   Code Status: Full Code  Home/SNF/Other Home Patient oriented to: self, place, time, and situation Is this baseline? Yes   Triage Complete: Triage complete  Chief Complaint Nausea & vomiting [R11.2]  Triage Note Pt reports he has been nauseous and vomiting since breaking his ribs so he stopped the meds and is still having N/V and reports no BM x 3 days.   Allergies Allergies  Allergen Reactions   Codeine Nausea And Vomiting    Level of Care/Admitting Diagnosis ED Disposition     ED Disposition  Admit   Condition  --   Comment  Hospital Area: [facility] [[id number]]  Level of Care: Stepdown [14]  Covid Evaluation: Asymptomatic - no recent exposure (last 10 days) testing not required  Diagnosis: Nausea & vomiting [277057]  Admitting Physician: Frankey Shown [[id number]]  Attending Physician: Frankey Shown [[id number]]  Certification:: I certify this patient will need inpatient services for at least 2 midnights          B Medical/Surgery History History reviewed. No pertinent past medical history. Past Surgical History:  Procedure Laterality Date   ANKLE SURGERY Left    ELBOW SURGERY       A IV Location/Drains/Wounds Patient Lines/Drains/Airways Status     Active Line/Drains/Airways     Name Placement date Placement time Site Days   Peripheral IV 03/18/23 20 G 1" Distal;Posterior;Right Forearm 03/18/23  1827  Forearm  less than 1            Intake/Output Last 24 hours  Intake/Output Summary (Last 24 hours) at 03/18/2023 2349 Last data filed at 03/18/2023 2305 Gross per 24 hour  Intake 300 ml  Output --  Net 300 ml    Labs/Imaging Results for orders placed or performed during the hospital encounter of 03/18/23 (from the past 48 hours)  Urinalysis, Routine w  reflex microscopic -Urine, Clean Catch     Status: None   Collection Time: 03/18/23  6:05 PM  Result Value Ref Range   Color, Urine YELLOW YELLOW   APPearance CLEAR CLEAR   Specific Gravity, Urine 1.010 1.005 - 1.030   pH 8.0 5.0 - 8.0   Glucose, UA NEGATIVE NEGATIVE mg/dL   Hgb urine dipstick NEGATIVE NEGATIVE   Bilirubin Urine NEGATIVE NEGATIVE   Ketones, ur NEGATIVE NEGATIVE mg/dL   Protein, ur NEGATIVE NEGATIVE mg/dL   Nitrite NEGATIVE NEGATIVE   Leukocytes,Ua NEGATIVE NEGATIVE    Comment: Performed at [facility], [address], [place], [place] [address]  Acetaminophen level     Status: Abnormal   Collection Time: 03/18/23  6:22 PM  Result Value Ref Range   Acetaminophen (Tylenol), Serum <10 (L) 10 - 30 ug/mL    Comment: (NOTE) Therapeutic concentrations vary significantly. A range of 10-30 ug/mL  may be an effective concentration for many patients. However, some  are best treated at concentrations outside of this range. Acetaminophen concentrations >150 ug/mL at 4 hours after ingestion  and >50 ug/mL at 12 hours after ingestion are often associated with  toxic reactions.  Performed at [facility], [address], [place], [place] [address]   CBC with Differential     Status: Abnormal   Collection Time: 03/18/23  6:32 PM  Result Value Ref Range   WBC 4.3 4.0 - 10.5 K/uL   RBC 2.68 (L)  4.22 - 5.81 MIL/uL   Hemoglobin 11.5 (L) 13.0 - 17.0 g/dL   HCT 56.2 (L) 13.0 - 86.5 %   MCV 109.7 (H) 80.0 - 100.0 fL   MCH 42.9 (H) 26.0 - 34.0 pg   MCHC 39.1 (H) 30.0 - 36.0 g/dL   RDW 78.4 69.6 - 29.5 %   Platelets 205 150 - 400 K/uL   nRBC 0.0 0.0 - 0.2 %   Neutrophils Relative % 64 %   Neutro Abs 2.8 1.7 - 7.7 K/uL   Lymphocytes Relative 18 %   Lymphs Abs 0.8 0.7 - 4.0 K/uL   Monocytes Relative 16 %   Monocytes Absolute 0.7 0.1 - 1.0 K/uL   Eosinophils Relative 1 %   Eosinophils Absolute 0.0 0.0 - 0.5 K/uL   Basophils Relative 0 %   Basophils Absolute 0.0 0.0 - 0.1 K/uL    Immature Granulocytes 1 %   Abs Immature Granulocytes 0.03 0.00 - 0.07 K/uL    Comment: Performed at [facility], [address] [place], [place] [address]  Comprehensive metabolic panel     Status: Abnormal   Collection Time: 03/18/23  6:32 PM  Result Value Ref Range   Sodium 133 (L) 135 - 145 mmol/L   Potassium 2.2 (LL) 3.5 - 5.1 mmol/L    Comment: CRITICAL RESULT CALLED TO, READ BACK BY AND VERIFIED WITH K.BELTON ON 03/18/2023 @19 :25 BY T.HAMER.   Chloride 87 (L) 98 - 111 mmol/L   CO2 34 (H) 22 - 32 mmol/L   Glucose, Bld 103 (H) 70 - 99 mg/dL    Comment: Glucose reference range applies only to samples taken after fasting for at least 8 hours.   BUN 8 6 - 20 mg/dL   Creatinine, Ser 2.44 0.61 - 1.24 mg/dL   Calcium 8.6 (L) 8.9 - 10.3 mg/dL   Total Protein 6.4 (L) 6.5 - 8.1 g/dL   Albumin 3.0 (L) 3.5 - 5.0 g/dL   AST 23 15 - 41 U/L   ALT 18 0 - 44 U/L   Alkaline Phosphatase 70 38 - 126 U/L   Total Bilirubin 1.2 (H) <1.2 mg/dL   GFR, Estimated >01 >02 mL/min    Comment: (NOTE) Calculated using the CKD-EPI Creatinine Equation (2021)    Anion gap 12 5 - 15    Comment: Performed at [facility], [address], [place], [place] [address]  Lipase, blood     Status: None   Collection Time: 03/18/23  6:32 PM  Result Value Ref Range   Lipase 39 11 - 51 U/L    Comment: Performed at [facility], [address], [place], [place] [address]  Magnesium     Status: None   Collection Time: 03/18/23  6:32 PM  Result Value Ref Range   Magnesium 1.7 1.7 - 2.4 mg/dL    Comment: Performed at [facility], [address], [place], [place] [address]   CT ABDOMEN PELVIS W CONTRAST Result Date: 03/18/2023 CLINICAL DATA:  Nausea and vomiting.  Recent rib fractures EXAM: CT ABDOMEN AND PELVIS WITH CONTRAST TECHNIQUE: Multidetector CT imaging of the abdomen and pelvis was performed using the standard protocol following bolus administration of intravenous contrast. RADIATION DOSE REDUCTION: This exam was  performed according to the departmental dose-optimization program which includes automated exposure control, adjustment of the mA and/or kV according to patient size and/or use of iterative reconstruction technique. CONTRAST:  OMNIPAQUE IOHEXOL 300 MG/ML  SOLN COMPARISON:  None Available. FINDINGS: Lower chest: Trace right  mild right basilar subsegmental atelectasis. Heart size is normal. Hepatobiliary: No focal liver abnormality is seen. No gallstones, gallbladder wall thickening, or biliary dilatation. Pancreas: Unremarkable. No pancreatic ductal dilatation or surrounding inflammatory changes. Spleen: Normal in size without focal abnormality. Adrenals/Urinary Tract: Unremarkable adrenal glands. Kidneys enhance symmetrically without focal lesion, stone, or hydronephrosis. Ureters are nondilated. Urinary bladder appears unremarkable for the degree of distention. Stomach/Bowel: Wall thickening of the gastric antrum and pylorus. There is also mild wall thickening involving several loops of small bowel within the abdomen. No evidence of bowel obstruction. Normal appendix in the right lower quadrant. No focal colonic wall thickening or inflammatory changes. Vascular/Lymphatic: Scattered aortoiliac atherosclerotic calcifications without aneurysm. No abdominopelvic lymphadenopathy. Reproductive: Prostate is unremarkable. Other: No free fluid. No abdominopelvic fluid collection. No pneumoperitoneum. No abdominal wall hernia. Musculoskeletal: No acute or significant osseous findings. IMPRESSION: 1. Findings most compatible with gastroenteritis. No evidence of bowel obstruction. 2. Aortic atherosclerosis (ICD10-I70.0). Electronically Signed   By: Duanne Guess D.O.   On: 03/18/2023 20:02    Pending Labs Unresulted Labs (From admission, onward)     Start     Ordered   03/25/23 0500  Creatinine, serum  (enoxaparin (LOVENOX)    CrCl >/= 30 ml/min)  Weekly,   R     Comments: while on enoxaparin therapy     03/18/23 2151   03/19/23 0500  Comprehensive metabolic panel  Tomorrow morning,   R        03/18/23 2151   03/19/23 0500  CBC  Tomorrow morning,   R        03/18/23 2151   03/19/23 0500  Magnesium  Tomorrow morning,   R        03/18/23 2151   03/19/23 0500  Phosphorus  Tomorrow morning,   R        03/18/23 2151   03/19/23 0500  Vitamin B12  Tomorrow morning,   R        03/18/23 2157   03/19/23 0500  Folate  Tomorrow morning,   R        03/18/23 2157   03/19/23 0500  HIV Antibody (routine testing w rflx)  Once,   R        03/19/23 0500            Vitals/Pain Today's Vitals   03/18/23 1758 03/18/23 2100 03/18/23 2245 03/18/23 2330  BP:  90/62 105/61 110/71  Pulse:  64 67 70  Resp:  13 14 20   Temp:      TempSrc:      SpO2:  96% 92% 95%  Weight: 152 lb (68.9 kg)     PainSc: 0-No pain       Isolation Precautions No active isolations  Medications Medications  potassium chloride 10 mEq in 100 mL IVPB (10 mEq Intravenous New Bag/Given 03/18/23 2306)  enoxaparin (LOVENOX) injection 40 mg (40 mg Subcutaneous Given 03/18/23 2306)  prochlorperazine (COMPAZINE) injection 10 mg (has no administration in time range)  lactated ringers infusion (0 mLs Intravenous Hold 03/18/23 2311)  docusate sodium (COLACE) capsule 100 mg (100 mg Oral Given 03/18/23 2306)  acetaminophen (TYLENOL) tablet 650 mg (has no administration in time range)  polyethylene glycol (MIRALAX / GLYCOLAX) packet 17 g (17 g Oral Not Given 03/18/23 2310)  ondansetron (ZOFRAN) injection 4 mg (4 mg Intravenous Given 03/18/23 1836)  lactated ringers bolus 1,000 mL (1,000 mLs Intravenous New Bag/Given 03/18/23 1958)  iohexol (OMNIPAQUE) 300 MG/ML solution 100 mL (100 mLs  Intravenous Contrast Given 03/18/23 1937)  potassium chloride SA (KLOR-CON M) CR tablet 40 mEq (40 mEq Oral Given 03/18/23 2306)    Mobility walks     Focused Assessments    R Recommendations: See Admitting Provider Note  Report given to:  Molly Maduro  Additional Notes: A&O x4  Ambulatory

## 2023-03-18 NOTE — ED Notes (Signed)
Patient transported to CT 

## 2023-03-18 NOTE — H&P (Signed)
History and Physical    Patient: Jeff Anderson MVH:[id number] DOB: [birth year] DOA: 03/18/2023 DOS: the patient was seen and examined on 03/18/2023 PCP: Richardean Chimera, MD  Patient coming from: Home  Chief Complaint:  Chief Complaint  Patient presents with   Emesis   HPI: Jeff Anderson is a 57 y.o. male with no significant medical history who presents to the emergency department due to 5 to 6-day onset of nausea and nonbilious, nonbloody vomiting and 3-day onset of constipation.  Patient recently presented to the emergency department on 03/11/2023 due to sustaining a fall in his bathroom and hitting the right scapula area which resulted in  right lateral 4th through 6th rib fractures who presents to the emergency department.  He was discharged with a prescription for Percocet and ibuprofen which he has been compliant with.  He stopped taking his medications due to nausea and vomiting and presented to the ED for further evaluation and management. Patient denies chest pain, shortness of breath, fever, chills, sick contacts.  ED Course:  In the emergency department, he was hemodynamically stable.  Workup in the ED showed macrocytic anemia, BMP was normal except for sodium of 133, chloride 87, potassium 2.2, blood glucose 103.  Acetaminophen level was less than 10, lipase 39. CT abdomen and pelvis with contrast showed findings most compatible gastroenteritis.  No evidence of bowel obstruction. Patient was treated with Zofran, potassium was replenished and IV LR 1 L was provided. Hospitalist was asked to admit patient for further evaluation and management.  Review of Systems: Review of systems as noted in the HPI. All other systems reviewed and are negative.   History reviewed. No pertinent past medical history. Past Surgical History:  Procedure Laterality Date   ANKLE SURGERY Left    ELBOW SURGERY      Social History:  reports that he has been smoking cigarettes. He has a 45  pack-year smoking history. He has never used smokeless tobacco. He reports current alcohol use of about 3.0 standard drinks of alcohol per week. He reports that he does not currently use drugs.   Allergies  Allergen Reactions   Codeine Nausea And Vomiting    History reviewed. No pertinent family history.   Prior to Admission medications   Medication Sig Start Date End Date Taking? Authorizing Provider  HYDROcodone-acetaminophen (NORCO/VICODIN) 5-325 MG tablet Take 1 tablet by mouth 4 (four) times daily. 03/14/23  Yes [provider]  oxyCODONE-acetaminophen (PERCOCET/ROXICET) 5-325 MG tablet Take 1-2 tablets by mouth every 6 (six) hours as needed for severe pain (pain score 7-10). 03/11/23   Pollyann Savoy, MD    Physical Exam: BP 90/62   Pulse 64   Temp 98.1 F (36.7 C) (Oral)   Resp 13   Wt 68.9 kg   SpO2 96%   BMI 21.20 kg/m   General: 57 y.o. year-old male well developed well nourished in no acute distress.  Alert and oriented x3. HEENT: NCAT, EOMI Neck: Supple, trachea medial Cardiovascular: Regular rate and rhythm with no rubs or gallops.  No thyromegaly or JVD noted.  No lower extremity edema. 2/4 pulses in all 4 extremities. Respiratory: Clear to auscultation with no wheezes or rales. Good inspiratory effort. Abdomen: Soft, nontender nondistended with normal bowel sounds x4 quadrants. Muskuloskeletal: No cyanosis, clubbing or edema noted bilaterally Neuro: CN II-XII intact, strength 5/5 x 4, sensation, reflexes intact Skin: No ulcerative lesions noted or rashes Psychiatry: Judgement and insight appear normal. Mood is appropriate for condition and  setting          Labs on Admission:  Basic Metabolic Panel: Recent Labs  Lab 03/18/23 1832  NA 133*  K 2.2*  CL 87*  CO2 34*  GLUCOSE 103*  BUN 8  CREATININE 0.62  CALCIUM 8.6*  MG 1.7   Liver Function Tests: Recent Labs  Lab 03/18/23 1832  AST 23  ALT 18  ALKPHOS 70  BILITOT 1.2*  PROT 6.4*   ALBUMIN 3.0*   Recent Labs  Lab 03/18/23 1832  LIPASE 39   No results for input(s): "AMMONIA" in the last 168 hours. CBC: Recent Labs  Lab 03/18/23 1832  WBC 4.3  NEUTROABS 2.8  HGB 11.5*  HCT 29.4*  MCV 109.7*  PLT 205   Cardiac Enzymes: No results for input(s): "CKTOTAL", "CKMB", "CKMBINDEX", "TROPONINI" in the last 168 hours.  BNP (last 3 results) No results for input(s): "BNP" in the last 8760 hours.  ProBNP (last 3 results) No results for input(s): "PROBNP" in the last 8760 hours.  CBG: No results for input(s): "GLUCAP" in the last 168 hours.  Radiological Exams on Admission: CT ABDOMEN PELVIS W CONTRAST Result Date: 03/18/2023 CLINICAL DATA:  Nausea and vomiting.  Recent rib fractures EXAM: CT ABDOMEN AND PELVIS WITH CONTRAST TECHNIQUE: Multidetector CT imaging of the abdomen and pelvis was performed using the standard protocol following bolus administration of intravenous contrast. RADIATION DOSE REDUCTION: This exam was performed according to the departmental dose-optimization program which includes automated exposure control, adjustment of the mA and/or kV according to patient size and/or use of iterative reconstruction technique. CONTRAST:  OMNIPAQUE IOHEXOL 300 MG/ML  SOLN COMPARISON:  None Available. FINDINGS: Lower chest: Trace right mild right basilar subsegmental atelectasis. Heart size is normal. Hepatobiliary: No focal liver abnormality is seen. No gallstones, gallbladder wall thickening, or biliary dilatation. Pancreas: Unremarkable. No pancreatic ductal dilatation or surrounding inflammatory changes. Spleen: Normal in size without focal abnormality. Adrenals/Urinary Tract: Unremarkable adrenal glands. Kidneys enhance symmetrically without focal lesion, stone, or hydronephrosis. Ureters are nondilated. Urinary bladder appears unremarkable for the degree of distention. Stomach/Bowel: Wall thickening of the gastric antrum and pylorus. There is also mild wall  thickening involving several loops of small bowel within the abdomen. No evidence of bowel obstruction. Normal appendix in the right lower quadrant. No focal colonic wall thickening or inflammatory changes. Vascular/Lymphatic: Scattered aortoiliac atherosclerotic calcifications without aneurysm. No abdominopelvic lymphadenopathy. Reproductive: Prostate is unremarkable. Other: No free fluid. No abdominopelvic fluid collection. No pneumoperitoneum. No abdominal wall hernia. Musculoskeletal: No acute or significant osseous findings. IMPRESSION: 1. Findings most compatible with gastroenteritis. No evidence of bowel obstruction. 2. Aortic atherosclerosis (ICD10-I70.0). Electronically Signed   By: Duanne Guess D.O.   On: 03/18/2023 20:02    EKG: I independently viewed the EKG done and my findings are as followed: Normal sinus rhythm at a rate of 75 bpm with QTc of 605 ms  Assessment/Plan Present on Admission:  Nausea & vomiting  Principal Problem:   Nausea & vomiting Active Problems:   Acute gastroenteritis   Hypokalemia   Prolonged QT interval   Constipation   Macrocytic anemia   Closed fracture of multiple ribs of right side  Nausea and vomiting possibly secondary to acute gastroenteritis Patient was treated with Zofran, we shall continue with IV Compazine due to prolonged QT interval Continue IV hydration Patient will be started on clear liquid diet in the morning with plan to advance as tolerated  Hypokalemia K+ is 2.2 K+ will be replenished Please  monitor for AM K+ for further replenishmemnt  Prolonged QT interval QTc 605 ms Avoid QT prolonging drugs Magnesium level will be checked Repeat EKG in the morning  Constipation Continue MiraLAX, docusate  Macrocytic anemia MCV 100.7 Within B12 folate levels will be checked  History of closed fracture of multiple ribs of right side Continue Tylenol as needed Continue incentive spirometry  DVT prophylaxis: Lovenox  Code  Status: Full code  Family Communication: None at bedside  Consults: None  Severity of Illness: The appropriate patient status for this patient is INPATIENT. Inpatient status is judged to be reasonable and necessary in order to provide the required intensity of service to ensure the patient's safety. The patient's presenting symptoms, physical exam findings, and initial radiographic and laboratory data in the context of their chronic comorbidities is felt to place them at high risk for further clinical deterioration. Furthermore, it is not anticipated that the patient will be medically stable for discharge from the hospital within 2 midnights of admission.   * I certify that at the point of admission it is my clinical judgment that the patient will require inpatient hospital care spanning beyond 2 midnights from the point of admission due to high intensity of service, high risk for further deterioration and high frequency of surveillance required.*  Author: Frankey Shown, DO 03/18/2023 10:19 PM  For on call review [email or web address].

## 2023-03-19 ENCOUNTER — Other Ambulatory Visit: Payer: Self-pay

## 2023-03-19 DIAGNOSIS — E860 Dehydration: Secondary | ICD-10-CM

## 2023-03-19 DIAGNOSIS — S2241XD Multiple fractures of ribs, right side, subsequent encounter for fracture with routine healing: Secondary | ICD-10-CM

## 2023-03-19 DIAGNOSIS — E871 Hypo-osmolality and hyponatremia: Secondary | ICD-10-CM

## 2023-03-19 DIAGNOSIS — E878 Other disorders of electrolyte and fluid balance, not elsewhere classified: Secondary | ICD-10-CM

## 2023-03-19 LAB — PHOSPHORUS: Phosphorus: 3.7 mg/dL (ref 2.5–4.6)

## 2023-03-19 LAB — CBC
HCT: 28.8 % — ABNORMAL LOW (ref 39.0–52.0)
Hemoglobin: 10.5 g/dL — ABNORMAL LOW (ref 13.0–17.0)
MCH: 41 pg — ABNORMAL HIGH (ref 26.0–34.0)
MCHC: 36.5 g/dL — ABNORMAL HIGH (ref 30.0–36.0)
MCV: 112.5 fL — ABNORMAL HIGH (ref 80.0–100.0)
Platelets: 207 10*3/uL (ref 150–400)
RBC: 2.56 MIL/uL — ABNORMAL LOW (ref 4.22–5.81)
RDW: 15.5 % (ref 11.5–15.5)
WBC: 4.9 10*3/uL (ref 4.0–10.5)
nRBC: 0 % (ref 0.0–0.2)

## 2023-03-19 LAB — COMPREHENSIVE METABOLIC PANEL
ALT: 14 U/L (ref 0–44)
AST: 17 U/L (ref 15–41)
Albumin: 2.7 g/dL — ABNORMAL LOW (ref 3.5–5.0)
Alkaline Phosphatase: 62 U/L (ref 38–126)
Anion gap: 11 (ref 5–15)
BUN: 9 mg/dL (ref 6–20)
CO2: 32 mmol/L (ref 22–32)
Calcium: 8.1 mg/dL — ABNORMAL LOW (ref 8.9–10.3)
Chloride: 91 mmol/L — ABNORMAL LOW (ref 98–111)
Creatinine, Ser: 0.65 mg/dL (ref 0.61–1.24)
GFR, Estimated: >60 mL/min (ref >60)
Glucose, Bld: 103 mg/dL — ABNORMAL HIGH (ref 70–99)
Potassium: 2.6 mmol/L — CL (ref 3.5–5.1)
Sodium: 134 mmol/L — ABNORMAL LOW (ref 135–145)
Total Bilirubin: 1.2 mg/dL — ABNORMAL HIGH (ref <1.2)
Total Protein: 5.5 g/dL — ABNORMAL LOW (ref 6.5–8.1)

## 2023-03-19 LAB — HIV ANTIBODY (ROUTINE TESTING W REFLEX): HIV Screen 4th Generation wRfx: NONREACTIVE

## 2023-03-19 LAB — MRSA NEXT GEN BY PCR, NASAL: MRSA by PCR Next Gen: NOT DETECTED

## 2023-03-19 LAB — MAGNESIUM: Magnesium: 1.8 mg/dL (ref 1.7–2.4)

## 2023-03-19 LAB — VITAMIN B12: Vitamin B-12: 350 pg/mL (ref 180–914)

## 2023-03-19 LAB — FOLATE: Folate: 2.2 ng/mL — ABNORMAL LOW (ref >5.9)

## 2023-03-19 MED ORDER — ORAL CARE MOUTH RINSE: 15.0000 mL | OROMUCOSAL | Status: DC | PRN

## 2023-03-19 MED ORDER — POTASSIUM CHLORIDE CRYS ER 20 MEQ PO TBCR
40.0000 meq | EXTENDED_RELEASE_TABLET | ORAL | Status: AC
  Administered 2023-03-19 (×3): 40 meq via ORAL
  Filled 2023-03-19 (×3): qty 2

## 2023-03-19 MED ORDER — MAGNESIUM SULFATE IN D5W 1-5 GM/100ML-% IV SOLN
1.0000 g | Freq: Once | INTRAVENOUS | Status: AC
  Administered 2023-03-19: 1 g via INTRAVENOUS
  Filled 2023-03-19: qty 100

## 2023-03-19 MED ORDER — MAGNESIUM SULFATE 2 GM/50ML IV SOLN
2.0000 g | Freq: Once | INTRAVENOUS | Status: AC
  Administered 2023-03-19: 2 g via INTRAVENOUS
  Filled 2023-03-19: qty 50

## 2023-03-19 MED ORDER — FOLIC ACID 1 MG PO TABS
1.0000 mg | ORAL_TABLET | Freq: Every day | ORAL | Status: DC
Start: 2023-03-19 — End: 2023-03-20
  Administered 2023-03-19 – 2023-03-20 (×2): 1 mg via ORAL
  Filled 2023-03-19 (×2): qty 1

## 2023-03-19 MED ORDER — POTASSIUM CHLORIDE CRYS ER 20 MEQ PO TBCR
40.0000 meq | EXTENDED_RELEASE_TABLET | Freq: Once | ORAL | Status: AC
  Administered 2023-03-19: 40 meq via ORAL
  Filled 2023-03-19: qty 2

## 2023-03-19 MED ORDER — POLYETHYLENE GLYCOL 3350 17 G PO PACK: 17.0000 g | PACK | Freq: Every day | ORAL | Status: DC | PRN

## 2023-03-19 MED ORDER — POTASSIUM CHLORIDE 10 MEQ/100ML IV SOLN
10.0000 meq | INTRAVENOUS | Status: AC
  Administered 2023-03-19 (×2): 10 meq via INTRAVENOUS
  Filled 2023-03-19 (×2): qty 100

## 2023-03-19 NOTE — Progress Notes (Signed)
Mobility Specialist Progress Note:    03/19/23 1159  Mobility  Activity Ambulated with assistance in hallway  Level of Assistance Independent  Assistive Device None  Distance Ambulated (ft) 450 ft  Range of Motion/Exercises Active;All extremities  Activity Response Tolerated well  Mobility Referral Yes  Mobility visit 1 Mobility  Mobility Specialist Start Time (ACUTE ONLY) 1135  Mobility Specialist Stop Time (ACUTE ONLY) 1145  Mobility Specialist Time Calculation (min) (ACUTE ONLY) 10 min   Pt received in bed, agreeable to mobility. Independently able to stand and ambulate with no AD. Tolerated well, asx throughout. Returned pt to room, all needs met.   Lawerance Bach Mobility Specialist Please contact via Special educational needs teacher or  Rehab office at [phone]

## 2023-03-19 NOTE — Plan of Care (Signed)

## 2023-03-19 NOTE — Progress Notes (Signed)
   03/19/23 1109  TOC Brief Assessment  Insurance and Status Reviewed  Patient has primary care physician Yes  Home environment has been reviewed Home with spouse  Prior level of function: Independent  Prior/Current Home Services No current home services  Social Drivers of Health Review SDOH reviewed no interventions necessary  Readmission risk has been reviewed Yes  Transition of care needs no transition of care needs at this time    Transition of Care Department [facility]) has reviewed patient and no TOC needs have been identified at this time. We will continue to monitor patient advancement through interdisciplinary progression rounds. If new patient transition needs arise, please place a TOC consult.

## 2023-03-19 NOTE — Plan of Care (Signed)
  Problem: Education: Goal: Knowledge of General Education information will improve Description Including pain rating scale, medication(s)/side effects and non-pharmacologic comfort measures Outcome: Progressing   Problem: Education: Goal: Knowledge of General Education information will improve Description Including pain rating scale, medication(s)/side effects and non-pharmacologic comfort measures Outcome: Progressing   

## 2023-03-19 NOTE — Plan of Care (Signed)

## 2023-03-19 NOTE — Progress Notes (Signed)
Progress Note   Patient: Jeff Anderson OZH:[id number] DOB: [birth year] DOA: 03/18/2023     1 DOS: the patient was seen and examined on 03/19/2023   Brief hospital admission course: As per H&P written by Dr. Thomes Dinning on 03/18/2023 Jeff Anderson is a 57 y.o. male with no significant medical history who presents to the emergency department due to 5 to 6-day onset of nausea and nonbilious, nonbloody vomiting and 3-day onset of constipation.  Patient recently presented to the emergency department on 03/11/2023 due to sustaining a fall in his bathroom and hitting the right scapula area which resulted in  right lateral 4th through 6th rib fractures who presents to the emergency department.  He was discharged with a prescription for Percocet and ibuprofen which he has been compliant with.  He stopped taking his medications due to nausea and vomiting and presented to the ED for further evaluation and management. Patient denies chest pain, shortness of breath, fever, chills, sick contacts.   ED Course:  In the emergency department, he was hemodynamically stable.  Workup in the ED showed macrocytic anemia, BMP was normal except for sodium of 133, chloride 87, potassium 2.2, blood glucose 103.  Acetaminophen level was less than 10, lipase 39. CT abdomen and pelvis with contrast showed findings most compatible gastroenteritis.  No evidence of bowel obstruction. Patient was treated with Zofran, potassium was replenished and IV LR 1 L was provided. Hospitalist was asked to admit patient for further evaluation and management..  Assessment and Plan: 1-nausea/vomiting/acute gastroenteritis -Patient reports improvement in nausea/vomiting; currently just feeling nauseated but no further vomiting -Diet will be advanced to full liquids -Continue as needed antiemetics and maintain adequate hydration -Will continue supportive care.  2-hypokalemia/hyponatremia/hypochloremia -Continue to replete electrolytes and  follow trend -Will also closely follow magnesium levels. -Maintain adequate hydration/IV fluids and follow electrolytes trend.  3-prolonged QT interval -In the setting of electrolytes abnormalities/dehydration most likely -Avoid medications that can further prolong QT -Continue telemetry monitoring -Maintain adequate hydration -Replete electrolytes and follow trend.  4-macrocytic anemia -B12 350 -Folate 2.2, will provide supplementation -Patient advised to maintain adequate nutrition and hydration.  5-history of constipation -Given ongoing GI losses and decreased oral intake we will hold Colace -MiraLAX changed to as needed. -Maintain adequate hydration.  6-history of multiple rib fractures -Continue incentive spirometer -Continue as needed analgesics.  Subjective:  Afebrile, no chest pain, no shortness of breath.  Patient reports feeling nauseated but currently no vomiting.  Physical Exam: Vitals:   03/19/23 0600 03/19/23 0700 03/19/23 0747 03/19/23 0800  BP: (!) 99/58 126/73  112/73  Pulse: 64 60  (!) 58  Resp:  16  15  Temp:   98 F (36.7 C)   TempSrc:   Oral   SpO2: 95% 94%  95%  Weight:      Height:       General exam: Alert, awake, oriented x 3; sleepy and tired. Respiratory system: Clear to auscultation. Respiratory effort normal.  Good saturation on room air. Cardiovascular system:RRR. No rubs or gallops; no JVD. Gastrointestinal system: Abdomen is nondistended, soft and nontender. No organomegaly or masses felt. Normal bowel sounds heard. Central nervous system: No focal neurological deficits. Extremities: No cyanosis or clubbing. Skin: No petechiae. Psychiatry: Judgement and insight appear normal.  Flat affect appreciated.  Data Reviewed: Basic metabolic panel: Sodium 134, potassium 2.6, chloride 91, BUN 9, creatinine 0.65, normal LFTs, GFR >60 CBC: WBCs 4.9, hemoglobin 10.5, MCV 112.5 and platelet count 207K. Magnesium: 1.8  Family Communication: No  family at bedside.  Disposition: Status is: Inpatient Remains inpatient appropriate because: Continue IV fluids and electrolyte repletion.   Planned Discharge Destination: Home  Time spent: 50 minutes  Author: Vassie Loll, MD 03/19/2023 9:26 AM  For on call review [email or web address].

## 2023-03-20 DIAGNOSIS — K529 Noninfective gastroenteritis and colitis, unspecified: Secondary | ICD-10-CM

## 2023-03-20 LAB — BASIC METABOLIC PANEL
Anion gap: 7 (ref 5–15)
BUN: 8 mg/dL (ref 6–20)
CO2: 31 mmol/L (ref 22–32)
Calcium: 8.4 mg/dL — ABNORMAL LOW (ref 8.9–10.3)
Chloride: 97 mmol/L — ABNORMAL LOW (ref 98–111)
Creatinine, Ser: 0.62 mg/dL (ref 0.61–1.24)
GFR, Estimated: >60 mL/min (ref >60)
Glucose, Bld: 103 mg/dL — ABNORMAL HIGH (ref 70–99)
Potassium: 4.1 mmol/L (ref 3.5–5.1)
Sodium: 135 mmol/L (ref 135–145)

## 2023-03-20 MED ORDER — PROCHLORPERAZINE MALEATE 10 MG PO TABS: 10.0000 mg | ORAL_TABLET | Freq: Four times a day (QID) | ORAL | 0 refills | Status: DC | PRN

## 2023-03-20 MED ORDER — PANTOPRAZOLE SODIUM 40 MG PO TBEC: 40.0000 mg | DELAYED_RELEASE_TABLET | Freq: Every day | ORAL | 1 refills | Status: DC

## 2023-03-20 MED ORDER — POLYETHYLENE GLYCOL 3350 17 G PO PACK: 17.0000 g | PACK | Freq: Every day | ORAL | 0 refills | Status: DC | PRN

## 2023-03-20 MED ORDER — DOCUSATE SODIUM 100 MG PO CAPS: 100.0000 mg | ORAL_CAPSULE | Freq: Two times a day (BID) | ORAL | 0 refills | Status: DC

## 2023-03-20 MED ORDER — FOLIC ACID 1 MG PO TABS: 1.0000 mg | ORAL_TABLET | Freq: Every day | ORAL | 3 refills | Status: AC

## 2023-03-20 NOTE — Discharge Summary (Signed)
Physician Discharge Summary   Patient: Jeff Anderson MRN: [id number] DOB: [birth year]  Admit date:     03/18/2023  Discharge date: 03/20/23  Discharge Physician: Vassie Loll   PCP: Richardean Chimera, MD   Recommendations at discharge:  Repeat CBC to follow hemoglobin trend/stability Repeat folate level in the next 6 weeks and determine the need of further supplementation. Continue assisting patient with tobacco cessation. Repeat basic metabolic panel to follow ultralights renal function. Consider outpatient lung cancer screening given history of tobacco abuse.  Discharge Diagnoses: Principal Problem:   Nausea & vomiting Active Problems:   Acute gastroenteritis   Hypokalemia   Prolonged QT interval   Constipation   Macrocytic anemia   Closed fracture of multiple ribs of right side  Brief hospital admission course: As per H&P written by Dr. Thomes Dinning on 03/18/2023 NIXXON GILSTRAP is a 57 y.o. male with no significant medical history who presents to the emergency department due to 5 to 6-day onset of nausea and nonbilious, nonbloody vomiting and 3-day onset of constipation.  Patient recently presented to the emergency department on 03/11/2023 due to sustaining a fall in his bathroom and hitting the right scapula area which resulted in  right lateral 4th through 6th rib fractures who presents to the emergency department.  He was discharged with a prescription for Percocet and ibuprofen which he has been compliant with.  He stopped taking his medications due to nausea and vomiting and presented to the ED for further evaluation and management. Patient denies chest pain, shortness of breath, fever, chills, sick contacts.   ED Course:  In the emergency department, he was hemodynamically stable.  Workup in the ED showed macrocytic anemia, BMP was normal except for sodium of 133, chloride 87, potassium 2.2, blood glucose 103.  Acetaminophen level was less than 10, lipase 39. CT abdomen and  pelvis with contrast showed findings most compatible gastroenteritis.  No evidence of bowel obstruction. Patient was treated with Zofran, potassium was replenished and IV LR 1 L was provided. Hospitalist was asked to admit patient for further evaluation and management..  Assessment and Plan: 1-nausea/vomiting/acute gastroenteritis -No fever; hemodynamically stable. -Diet has been advanced and well-tolerated; patient advised to maintain adequate hydration and continue as needed antiemetics. -No active vomiting and electrolytes within normal limits at discharge.   2-hypokalemia/hyponatremia/hypochloremia -Repleted and within normal limits at time of discharge. -Repeat basic metabolic panel at follow-up visit to assess electrolytes stability.   3-prolonged QT interval -In the setting of electrolytes abnormalities/dehydration most likely -No further abnormalities appreciated on EKG or telemetry -Patient denies any chest pain or lightheadedness.   4-macrocytic anemia -B12 350 -Folate 2.2, will provide supplementation -Patient advised to maintain adequate nutrition and hydration.   5-history of constipation -Continue the use of Colace and as needed MiraLAX -Recent analgesic therapy with narcotics for rib fractures most likely playing a role -Patient advised to maintain adequate hydration and to increase fiber intake.   6-history of multiple rib fractures -Continue incentive spirometer -Continue as needed analgesics.  7-GERD/GI prophylaxis -Patient has been discharged on PPI.  8-tobacco abuse -Cessation counseling provided -Patient is not ready to quit -Outpatient lung cancer screening recommended.  Consultants: None Procedures performed: See below for x-ray reports. Disposition: Home Diet recommendation: Regular diet.  DISCHARGE MEDICATION: Allergies as of 03/20/2023       Reactions   Codeine Nausea And Vomiting        Medication List     STOP taking these  medications  HYDROcodone-acetaminophen 5-325 MG tablet Commonly known as: NORCO/VICODIN       TAKE these medications    docusate sodium 100 MG capsule Commonly known as: Colace Take 1 capsule (100 mg total) by mouth 2 (two) times daily.   folic acid 1 MG tablet Commonly known as: FOLVITE Take 1 tablet (1 mg total) by mouth daily.   oxyCODONE-acetaminophen 5-325 MG tablet Commonly known as: PERCOCET/ROXICET Take 1-2 tablets by mouth every 6 (six) hours as needed for severe pain (pain score 7-10).   pantoprazole 40 MG tablet Commonly known as: Protonix Take 1 tablet (40 mg total) by mouth daily.   polyethylene glycol 17 g packet Commonly known as: MiraLax Take 17 g by mouth daily as needed.   prochlorperazine 10 MG tablet Commonly known as: COMPAZINE Take 1 tablet (10 mg total) by mouth every 6 (six) hours as needed for nausea or vomiting.        Follow-up Information     Richardean Chimera, MD. Schedule an appointment as soon as possible for a visit in 10 day(s).   Specialty: Family Medicine Contact information: [address] [place] [address] [phone]                Discharge Exam: Ceasar Mons Weights   03/18/23 1758 03/19/23 0031  Weight: 68.9 kg 62.5 kg   General exam: Alert, awake, oriented x 3 Respiratory system: Clear to auscultation. Respiratory effort normal. Cardiovascular system:RRR. No murmurs, rubs, gallops. Gastrointestinal system: Abdomen is nondistended, soft and nontender. No organomegaly or masses felt. Normal bowel sounds heard. Central nervous system: Alert and oriented. No focal neurological deficits. Extremities: No C/C/E, +pedal pulses Skin: No rashes, lesions or ulcers Psychiatry: Judgement and insight appear normal. Mood & affect appropriate.    Condition at discharge: Stable and improved.  The results of significant diagnostics from this hospitalization (including imaging, microbiology, ancillary and laboratory) are listed  below for reference.   Imaging Studies: CT ABDOMEN PELVIS W CONTRAST Result Date: 03/18/2023 CLINICAL DATA:  Nausea and vomiting.  Recent rib fractures EXAM: CT ABDOMEN AND PELVIS WITH CONTRAST TECHNIQUE: Multidetector CT imaging of the abdomen and pelvis was performed using the standard protocol following bolus administration of intravenous contrast. RADIATION DOSE REDUCTION: This exam was performed according to the departmental dose-optimization program which includes automated exposure control, adjustment of the mA and/or kV according to patient size and/or use of iterative reconstruction technique. CONTRAST:  OMNIPAQUE IOHEXOL 300 MG/ML  SOLN COMPARISON:  None Available. FINDINGS: Lower chest: Trace right mild right basilar subsegmental atelectasis. Heart size is normal. Hepatobiliary: No focal liver abnormality is seen. No gallstones, gallbladder wall thickening, or biliary dilatation. Pancreas: Unremarkable. No pancreatic ductal dilatation or surrounding inflammatory changes. Spleen: Normal in size without focal abnormality. Adrenals/Urinary Tract: Unremarkable adrenal glands. Kidneys enhance symmetrically without focal lesion, stone, or hydronephrosis. Ureters are nondilated. Urinary bladder appears unremarkable for the degree of distention. Stomach/Bowel: Wall thickening of the gastric antrum and pylorus. There is also mild wall thickening involving several loops of small bowel within the abdomen. No evidence of bowel obstruction. Normal appendix in the right lower quadrant. No focal colonic wall thickening or inflammatory changes. Vascular/Lymphatic: Scattered aortoiliac atherosclerotic calcifications without aneurysm. No abdominopelvic lymphadenopathy. Reproductive: Prostate is unremarkable. Other: No free fluid. No abdominopelvic fluid collection. No pneumoperitoneum. No abdominal wall hernia. Musculoskeletal: No acute or significant osseous findings. IMPRESSION: 1. Findings most compatible  with gastroenteritis. No evidence of bowel obstruction. 2. Aortic atherosclerosis (ICD10-I70.0). Electronically Signed  By: Duanne Guess D.O.   On: 03/18/2023 20:02   DG Ribs Unilateral W/Chest Right Result Date: 03/11/2023 CLINICAL DATA:  Fall, right rib pain EXAM: RIGHT RIBS AND CHEST - 3+ VIEW COMPARISON:  None Available. FINDINGS: Fractures through the right lateral 4th through 6th ribs. No pneumothorax or effusion. Mild hyperinflation of the lungs. No confluent opacities. Heart is normal size. IMPRESSION: Right lateral 4th through 6th rib fractures.  No pneumothorax. Electronically Signed   By: Charlett Nose M.D.   On: 03/11/2023 00:37   DG Shoulder Right Result Date: 03/11/2023 CLINICAL DATA:  Recent fall with right shoulder pain, initial encounter EXAM: RIGHT SHOULDER - 2+ VIEW COMPARISON:  None Available. FINDINGS: No acute fracture or dislocation is noted in the shoulder joint. The clavicle appears within normal limits. Multiple rib fractures are noted posterolaterally on the right to include the fourth, fifth and sixth ribs. IMPRESSION: No acute shoulder abnormality noted. Posterolateral right rib fractures. Electronically Signed   By: Alcide Clever M.D.   On: 03/11/2023 00:37    Microbiology: Results for orders placed or performed during the hospital encounter of 03/18/23  MRSA Next Gen by PCR, Nasal     Status: None   Collection Time: 03/18/23 12:29 AM   Specimen: Nasal Mucosa; Nasal Swab  Result Value Ref Range Status   MRSA by PCR Next Gen NOT DETECTED NOT DETECTED Final    Comment: (NOTE) The GeneXpert MRSA Assay (FDA approved for NASAL specimens only), is one component of a comprehensive MRSA colonization surveillance program. It is not intended to diagnose MRSA infection nor to guide or monitor treatment for MRSA infections. Test performance is not FDA approved in patients less than 12 years old. Performed at [facility], [address], [place], [place] [address]      Labs: CBC: Recent Labs  Lab 03/18/23 1832 03/19/23 0509  WBC 4.3 4.9  NEUTROABS 2.8  --   HGB 11.5* 10.5*  HCT 29.4* 28.8*  MCV 109.7* 112.5*  PLT 205 207   Basic Metabolic Panel: Recent Labs  Lab 03/18/23 1832 03/19/23 0509 03/20/23 0418  NA 133* 134* 135  K 2.2* 2.6* 4.1  CL 87* 91* 97*  CO2 34* 32 31  GLUCOSE 103* 103* 103*  BUN 8 9 8   CREATININE 0.62 0.65 0.62  CALCIUM 8.6* 8.1* 8.4*  MG 1.7 1.8  --   PHOS  --  3.7  --    Liver Function Tests: Recent Labs  Lab 03/18/23 1832 03/19/23 0509  AST 23 17  ALT 18 14  ALKPHOS 70 62  BILITOT 1.2* 1.2*  PROT 6.4* 5.5*  ALBUMIN 3.0* 2.7*   CBG: No results for input(s): "GLUCAP" in the last 168 hours.  Discharge time spent: greater than 30 minutes.  Signed: Vassie Loll, MD [facility] 03/20/2023

## 2023-03-20 NOTE — TOC Transition Note (Signed)
Transition of Care [facility]) - Discharge Note   Patient Details  Name: Jeff Anderson MRN: [id number] Date of Birth: 05-Aug-1965  [facility] CM/SW Contact:  Leitha Bleak, RN Phone Number: 03/20/2023, 11:34 AM   Clinical Narrative:   Patient discharging home, Substance abuse resources added to AVS.   Final next level of care: Home/Self Care Barriers to Discharge: Barriers Resolved         Patient and family notified of of transfer: 03/20/23  Discharge Plan and Services Additional resources added to the After Visit Summary for        Social Drivers of Health (SDOH) Interventions SDOH Screenings   Food Insecurity: No Food Insecurity (03/19/2023)  Housing: Low Risk  (03/19/2023)  Transportation Needs: No Transportation Needs (03/19/2023)  Utilities: Not At Risk (03/19/2023)  Depression (PHQ2-9): Low Risk  (08/09/2021)  Tobacco Use: High Risk (03/18/2023)     Readmission Risk Interventions     No data to display

## 2023-03-20 NOTE — Progress Notes (Signed)
Patient presenting with nausea and vomiting, PRN compazine given.

## 2023-03-21 ENCOUNTER — Other Ambulatory Visit: Payer: Self-pay

## 2023-03-21 ENCOUNTER — Emergency Department (HOSPITAL_COMMUNITY): Payer: Self-pay

## 2023-03-21 ENCOUNTER — Inpatient Hospital Stay (HOSPITAL_COMMUNITY)
Admission: EM | Admit: 2023-03-21 | Discharge: 2023-03-25 | DRG: 871 | Disposition: A | Discharge status: Home / Self Care | Payer: Self-pay | Attending: Internal Medicine | Admitting: Internal Medicine

## 2023-03-21 DIAGNOSIS — Z885 Allergy status to narcotic agent status: Secondary | ICD-10-CM

## 2023-03-21 DIAGNOSIS — K5900 Constipation, unspecified: Secondary | ICD-10-CM | POA: Diagnosis present

## 2023-03-21 DIAGNOSIS — D539 Nutritional anemia, unspecified: Secondary | ICD-10-CM | POA: Diagnosis present

## 2023-03-21 DIAGNOSIS — Z1152 Encounter for screening for COVID-19: Secondary | ICD-10-CM

## 2023-03-21 DIAGNOSIS — J189 Pneumonia, unspecified organism: Secondary | ICD-10-CM | POA: Diagnosis present

## 2023-03-21 DIAGNOSIS — F1010 Alcohol abuse, uncomplicated: Secondary | ICD-10-CM | POA: Diagnosis present

## 2023-03-21 DIAGNOSIS — K219 Gastro-esophageal reflux disease without esophagitis: Secondary | ICD-10-CM | POA: Diagnosis present

## 2023-03-21 DIAGNOSIS — R112 Nausea with vomiting, unspecified: Secondary | ICD-10-CM | POA: Diagnosis present

## 2023-03-21 DIAGNOSIS — J690 Pneumonitis due to inhalation of food and vomit: Secondary | ICD-10-CM | POA: Diagnosis present

## 2023-03-21 DIAGNOSIS — A419 Sepsis, unspecified organism: Principal | ICD-10-CM | POA: Diagnosis present

## 2023-03-21 DIAGNOSIS — J9601 Acute respiratory failure with hypoxia: Secondary | ICD-10-CM | POA: Diagnosis present

## 2023-03-21 DIAGNOSIS — D509 Iron deficiency anemia, unspecified: Secondary | ICD-10-CM | POA: Diagnosis present

## 2023-03-21 DIAGNOSIS — E871 Hypo-osmolality and hyponatremia: Secondary | ICD-10-CM | POA: Diagnosis present

## 2023-03-21 DIAGNOSIS — E872 Acidosis, unspecified: Secondary | ICD-10-CM | POA: Diagnosis present

## 2023-03-21 DIAGNOSIS — F17200 Nicotine dependence, unspecified, uncomplicated: Secondary | ICD-10-CM

## 2023-03-21 DIAGNOSIS — F17210 Nicotine dependence, cigarettes, uncomplicated: Secondary | ICD-10-CM | POA: Diagnosis present

## 2023-03-21 DIAGNOSIS — Z79899 Other long term (current) drug therapy: Secondary | ICD-10-CM

## 2023-03-21 DIAGNOSIS — K529 Noninfective gastroenteritis and colitis, unspecified: Secondary | ICD-10-CM | POA: Diagnosis present

## 2023-03-21 DIAGNOSIS — E876 Hypokalemia: Secondary | ICD-10-CM | POA: Diagnosis present

## 2023-03-21 LAB — CBC WITH DIFFERENTIAL/PLATELET

## 2023-03-21 MED ORDER — SODIUM CHLORIDE 0.9% FLUSH
10.0000 mL | Freq: Two times a day (BID) | INTRAVENOUS | Status: DC
  Administered 2023-03-22 – 2023-03-24 (×7): 10 mL via INTRAVENOUS

## 2023-03-21 NOTE — ED Triage Notes (Signed)
Pt arrives with reports of abdominal pain, emesis, SHOB and shaking. Pt reports he was recently admitted to hospital for hypokalemia and d/c yesterday. Pt reports he feels like he is withdrawing from alcohol, reports last drink 8 days ago.

## 2023-03-21 NOTE — ED Provider Triage Note (Signed)
Emergency Medicine Provider Triage Evaluation Note  Jeff Anderson , a 57 y.o. male  was evaluated in triage.  Pt complains of sob, cough, vomiting, and pain in the abdomen and chest.  He thinks that he is withdrawing.  Last ETOH was Sunday.    Review of Systems  Positive: Sob, cough, vomiting, shaking Negative: fever  Physical Exam  BP 112/79 (BP Location: Right Arm)   Pulse 80   Temp 97.9 F (36.6 C) (Oral)   Resp (!) 24   SpO2 90%  Gen:   Awake, ill appearing Resp:  Mildly increased WOB MSK:   Moves extremities without difficulty  Other:    Medical Decision Making  Medically screening exam initiated at 11:45 PM.  Appropriate orders placed.  Daisey Must was informed that the remainder of the evaluation will be completed by another provider, this initial triage assessment does not replace that evaluation, and the importance of remaining in the ED until their evaluation is complete.  Needs next room.  Consulting civil engineer notified.   Roxy Horseman, PA-C 03/21/23 2346

## 2023-03-21 NOTE — Sepsis Progress Note (Signed)
Elink monitoring for the code sepsis protocol.   0020: Notified provider of need to order antibiotics and fluid boluses.    0110: Notified provider of need to order 3rd lactic acid.

## 2023-03-22 ENCOUNTER — Emergency Department (HOSPITAL_COMMUNITY): Payer: Self-pay

## 2023-03-22 ENCOUNTER — Encounter (HOSPITAL_COMMUNITY): Payer: Self-pay

## 2023-03-22 DIAGNOSIS — J9601 Acute respiratory failure with hypoxia: Secondary | ICD-10-CM

## 2023-03-22 DIAGNOSIS — F1010 Alcohol abuse, uncomplicated: Secondary | ICD-10-CM

## 2023-03-22 DIAGNOSIS — J189 Pneumonia, unspecified organism: Secondary | ICD-10-CM | POA: Diagnosis present

## 2023-03-22 LAB — CBC WITH DIFFERENTIAL/PLATELET
Basophils Relative: 0.1 10*3/uL (ref 0.0–0.1)
Eosinophils Absolute: 0 10*3/uL (ref 0.0–0.5)
Eosinophils Relative: 0 10*3/uL (ref 0.0–0.5)
HCT: 39.2 % (ref 39.0–52.0)
Hemoglobin: 14.4 g/dL (ref 13.0–17.0)
Immature Granulocytes: 1 10*3/uL (ref 0.0–0.1)
Lymphocytes Relative: 2 %
Lymphs Abs: 0.5 10*3/uL — ABNORMAL LOW (ref 0.7–4.0)
MCH: 43.4 pg — ABNORMAL HIGH (ref 26.0–34.0)
MCHC: 36.7 g/dL — ABNORMAL HIGH (ref 30.0–36.0)
MCV: 118.1 fL — ABNORMAL HIGH (ref 80.0–100.0)
Monocytes Absolute: 0 10*3/uL (ref 0.1–1.0)
Monocytes Relative: 1.1 10*3/uL — ABNORMAL HIGH (ref 0.1–1.0)
Monocytes Relative: 4 10*3/uL (ref 0.7–4.0)
Neutro Abs: 27.6 10*3/uL — ABNORMAL HIGH (ref 1.7–7.7)
Neutrophils Relative %: 93 %
Platelets: 499 10*3/uL — ABNORMAL HIGH (ref 150–400)
RBC: 3.32 MIL/uL — ABNORMAL LOW (ref 4.22–5.81)
RDW: 15.5 % (ref 11.5–15.5)
WBC Morphology: 0.21 10*3/uL — ABNORMAL HIGH (ref 0.00–0.07)
WBC: 29.5 10*3/uL — ABNORMAL HIGH (ref 4.0–10.5)
nRBC: 0 % (ref 0.0–0.2)

## 2023-03-22 LAB — URINALYSIS, W/ REFLEX TO CULTURE (INFECTION SUSPECTED)
Bacteria, UA: NONE SEEN
Bilirubin Urine: NEGATIVE
Glucose, UA: 50 mg/dL — AB
Ketones, ur: NEGATIVE mg/dL
Leukocytes,Ua: NEGATIVE
Nitrite: NEGATIVE
Protein, ur: 30 mg/dL — AB
Specific Gravity, Urine: >1.046 — ABNORMAL HIGH (ref 1.005–1.030)
pH: 5 (ref 5.0–8.0)

## 2023-03-22 LAB — HEMOGLOBIN A1C
Hgb A1c MFr Bld: 3.9 % — ABNORMAL LOW (ref 4.8–5.6)
Mean Plasma Glucose: 65.23 mg/dL

## 2023-03-22 LAB — COMPREHENSIVE METABOLIC PANEL
ALT: 19 U/L (ref 0–44)
AST: 28 U/L (ref 15–41)
Albumin: 3.3 g/dL — ABNORMAL LOW (ref 3.5–5.0)
Alkaline Phosphatase: 85 U/L (ref 38–126)
Anion gap: 15 (ref 5–15)
BUN: 17 mg/dL (ref 6–20)
CO2: 24 mmol/L (ref 22–32)
Calcium: 8.7 mg/dL — ABNORMAL LOW (ref 8.9–10.3)
Chloride: 93 mmol/L — ABNORMAL LOW (ref 98–111)
Creatinine, Ser: 1.07 mg/dL (ref 0.61–1.24)
GFR, Estimated: >60 mL/min (ref >60)
Glucose, Bld: 207 mg/dL — ABNORMAL HIGH (ref 70–99)
Potassium: 3.5 mmol/L (ref 3.5–5.1)
Sodium: 132 mmol/L — ABNORMAL LOW (ref 135–145)
Total Bilirubin: 2.6 mg/dL — ABNORMAL HIGH (ref <1.2)
Total Protein: 6.9 g/dL (ref 6.5–8.1)

## 2023-03-22 LAB — CREATININE, SERUM
Creatinine, Ser: 1.01 mg/dL (ref 0.61–1.24)
GFR, Estimated: >60 mL/min (ref >60)

## 2023-03-22 LAB — I-STAT CG4 LACTIC ACID, ED
Lactic Acid, Venous: 2.9 mmol/L (ref 0.5–1.9)
Lactic Acid, Venous: 4.4 mmol/L (ref 0.5–1.9)
Lactic Acid, Venous: 6.2 mmol/L (ref 0.5–1.9)

## 2023-03-22 LAB — TSH: TSH: 1.273 u[IU]/mL (ref 0.350–4.500)

## 2023-03-22 LAB — PROCALCITONIN: Procalcitonin: 21.85 ng/mL

## 2023-03-22 LAB — LACTIC ACID, PLASMA
Lactic Acid, Venous: 1.9 mmol/L (ref 0.5–1.9)
Lactic Acid, Venous: 1.8 mmol/L (ref 0.5–1.9)

## 2023-03-22 LAB — CBC
HCT: 34.4 % — ABNORMAL LOW (ref 39.0–52.0)
Hemoglobin: 12.6 g/dL — ABNORMAL LOW (ref 13.0–17.0)
MCH: 43.3 pg — ABNORMAL HIGH (ref 26.0–34.0)
MCHC: 36.6 g/dL — ABNORMAL HIGH (ref 30.0–36.0)
MCV: 118.2 fL — ABNORMAL HIGH (ref 80.0–100.0)
Platelets: 350 10*3/uL (ref 150–400)
RBC: 2.91 MIL/uL — ABNORMAL LOW (ref 4.22–5.81)
RDW: 15.3 % (ref 11.5–15.5)
WBC: 22.2 10*3/uL — ABNORMAL HIGH (ref 4.0–10.5)
nRBC: 0 % (ref 0.0–0.2)

## 2023-03-22 LAB — EXPECTORATED SPUTUM ASSESSMENT W GRAM STAIN, RFLX TO RESP C

## 2023-03-22 LAB — RESP PANEL BY RT-PCR (RSV, FLU A&B, COVID)  RVPGX2
Influenza A by PCR: NEGATIVE
Influenza B by PCR: NEGATIVE
Resp Syncytial Virus by PCR: NEGATIVE
SARS Coronavirus 2 by RT PCR: NEGATIVE

## 2023-03-22 LAB — APTT: aPTT: 23 s — ABNORMAL LOW (ref 24–36)

## 2023-03-22 LAB — PROTIME-INR
INR: 1.1 (ref 0.8–1.2)
Prothrombin Time: 14.5 s (ref 11.4–15.2)

## 2023-03-22 LAB — STREP PNEUMONIAE URINARY ANTIGEN: Strep Pneumo Urinary Antigen: NEGATIVE

## 2023-03-22 LAB — C-REACTIVE PROTEIN: CRP: 9.8 mg/dL — ABNORMAL HIGH (ref <1.0)

## 2023-03-22 MED ORDER — GUAIFENESIN ER 600 MG PO TB12
600.0000 mg | ORAL_TABLET | Freq: Two times a day (BID) | ORAL | Status: DC | PRN
  Administered 2023-03-22 – 2023-03-24 (×2): 600 mg via ORAL
  Filled 2023-03-22 (×2): qty 1

## 2023-03-22 MED ORDER — OXYCODONE HCL 5 MG PO TABS: 5.0000 mg | ORAL_TABLET | ORAL | Status: DC | PRN

## 2023-03-22 MED ORDER — SODIUM CHLORIDE 0.9 % IV BOLUS (SEPSIS)
1000.0000 mL | Freq: Once | INTRAVENOUS | Status: AC
  Administered 2023-03-22: 1000 mL via INTRAVENOUS

## 2023-03-22 MED ORDER — NICOTINE 21 MG/24HR TD PT24
21.0000 mg | MEDICATED_PATCH | Freq: Every day | TRANSDERMAL | Status: DC
Start: 2023-03-22 — End: 2023-03-25
  Administered 2023-03-22 – 2023-03-25 (×4): 21 mg via TRANSDERMAL
  Filled 2023-03-22 (×4): qty 1

## 2023-03-22 MED ORDER — SODIUM CHLORIDE 0.9 % IV SOLN: 500.0000 mg | INTRAVENOUS | Status: DC

## 2023-03-22 MED ORDER — IOHEXOL 300 MG/ML  SOLN
100.0000 mL | Freq: Once | INTRAMUSCULAR | Status: AC | PRN
  Administered 2023-03-22: 100 mL via INTRAVENOUS

## 2023-03-22 MED ORDER — ONDANSETRON HCL 4 MG/2ML IJ SOLN
4.0000 mg | Freq: Once | INTRAMUSCULAR | Status: AC
  Administered 2023-03-22: 4 mg via INTRAVENOUS
  Filled 2023-03-22: qty 2

## 2023-03-22 MED ORDER — VANCOMYCIN HCL 750 MG/150ML IV SOLN
750.0000 mg | Freq: Two times a day (BID) | INTRAVENOUS | Status: DC
  Administered 2023-03-22: 750 mg via INTRAVENOUS
  Filled 2023-03-22: qty 150

## 2023-03-22 MED ORDER — ALBUTEROL SULFATE (2.5 MG/3ML) 0.083% IN NEBU
2.5000 mg | INHALATION_SOLUTION | RESPIRATORY_TRACT | Status: DC | PRN
  Administered 2023-03-22: 2.5 mg via RESPIRATORY_TRACT
  Filled 2023-03-22: qty 3

## 2023-03-22 MED ORDER — SODIUM CHLORIDE 0.9 % IV SOLN
2.0000 g | Freq: Once | INTRAVENOUS | Status: AC
  Administered 2023-03-22: 2 g via INTRAVENOUS
  Filled 2023-03-22: qty 12.5

## 2023-03-22 MED ORDER — ACETAMINOPHEN 650 MG RE SUPP: 650.0000 mg | Freq: Four times a day (QID) | RECTAL | Status: DC | PRN

## 2023-03-22 MED ORDER — VANCOMYCIN HCL IN DEXTROSE 1-5 GM/200ML-% IV SOLN
1000.0000 mg | Freq: Once | INTRAVENOUS | Status: AC
  Administered 2023-03-22: 1000 mg via INTRAVENOUS
  Filled 2023-03-22: qty 200

## 2023-03-22 MED ORDER — DOXYCYCLINE HYCLATE 100 MG PO TABS
100.0000 mg | ORAL_TABLET | Freq: Two times a day (BID) | ORAL | Status: DC
  Filled 2023-03-22: qty 1

## 2023-03-22 MED ORDER — ACETAMINOPHEN 325 MG PO TABS: 650.0000 mg | ORAL_TABLET | Freq: Four times a day (QID) | ORAL | Status: DC | PRN

## 2023-03-22 MED ORDER — DOXYCYCLINE HYCLATE 100 MG IV SOLR
100.0000 mg | Freq: Two times a day (BID) | INTRAVENOUS | Status: DC
  Administered 2023-03-22 (×2): 100 mg via INTRAVENOUS
  Filled 2023-03-22 (×2): qty 100

## 2023-03-22 MED ORDER — PANTOPRAZOLE SODIUM 40 MG IV SOLR
40.0000 mg | Freq: Once | INTRAVENOUS | Status: AC
  Administered 2023-03-22: 40 mg via INTRAVENOUS
  Filled 2023-03-22: qty 10

## 2023-03-22 MED ORDER — SODIUM CHLORIDE 0.9 % IV SOLN
2.0000 g | INTRAVENOUS | Status: DC
  Filled 2023-03-22: qty 20

## 2023-03-22 MED ORDER — HEPARIN SODIUM (PORCINE) 5000 UNIT/ML IJ SOLN
5000.0000 [IU] | Freq: Three times a day (TID) | INTRAMUSCULAR | Status: DC
  Administered 2023-03-22 – 2023-03-24 (×7): 5000 [IU] via SUBCUTANEOUS
  Filled 2023-03-22 (×7): qty 1

## 2023-03-22 MED ORDER — METOCLOPRAMIDE HCL 5 MG/ML IJ SOLN
10.0000 mg | Freq: Once | INTRAMUSCULAR | Status: AC
  Administered 2023-03-22: 10 mg via INTRAVENOUS
  Filled 2023-03-22: qty 2

## 2023-03-22 MED ORDER — SODIUM CHLORIDE 0.9 % IV BOLUS (SEPSIS)
1000.0000 mL | Freq: Once | INTRAVENOUS | Status: AC
Start: 2023-03-22 — End: 2023-03-22
  Administered 2023-03-22: 1000 mL via INTRAVENOUS

## 2023-03-22 MED ORDER — DOXYCYCLINE HYCLATE 100 MG PO TABS
100.0000 mg | ORAL_TABLET | Freq: Two times a day (BID) | ORAL | Status: DC
  Administered 2023-03-23: 100 mg via ORAL
  Filled 2023-03-22: qty 1

## 2023-03-22 MED ORDER — SODIUM CHLORIDE 0.9 % IV SOLN: INTRAVENOUS | Status: DC

## 2023-03-22 MED ORDER — CEFEPIME HCL 2 G IV SOLR
2.0000 g | Freq: Three times a day (TID) | INTRAVENOUS | Status: DC
  Administered 2023-03-22 – 2023-03-23 (×4): 2 g via INTRAVENOUS
  Filled 2023-03-22 (×4): qty 12.5

## 2023-03-22 NOTE — Progress Notes (Signed)
Pharmacy Antibiotic Note  Jeff Anderson is a 57 y.o. male admitted on 03/21/2023 with pneumonia.  Pharmacy has been consulted for Cefepime + Vancomycin dosing.  Plan: Cefepime 2gm IV q8h Vancomycin 750mg  IV q12h to target AUC 400-550.  Estimated AUC on this regimen = 441. Monitor renal function and cx data   Height: 5\' 11"  (180.3 cm) Weight: 65.8 kg (145 lb) IBW/kg (Calculated) : 75.3  Temp (24hrs), Avg:97.9 F (36.6 C), Min:97.9 F (36.6 C), Max:97.9 F (36.6 C)  Recent Labs  Lab 03/18/23 1832 03/19/23 0509 03/20/23 0418 03/21/23 2335 03/22/23 0010 03/22/23 0054 03/22/23 0239  WBC 4.3 4.9  --  29.5*  --   --   --   CREATININE 0.62 0.65 0.62 1.07  --   --   --   LATICACIDVEN  --   --   --   --  6.2* 4.4* 2.9*    Estimated Creatinine Clearance: 70.9 mL/min (by C-G formula based on SCr of 1.07 mg/dL).    Allergies  Allergen Reactions   Codeine Nausea And Vomiting    Antimicrobials this admission: 12/19 Cefepime >>  12/19 Vancomycin >>   Dose adjustments this admission:  Microbiology results: 12/19 BCx:  12/18 Resp PCR: negative 12/15 MRSA PCR: negative  Thank you for allowing pharmacy to be a part of this patient's care.  Junita Push PharmD 03/22/2023 6:03 AM

## 2023-03-22 NOTE — H&P (Addendum)
History and Physical    Haydar Merrett ZOX:[id number] DOB: 02-18-1966 DOA: 03/21/2023  PCP: Richardean Chimera, MD  Patient coming from: Home  I have personally briefly reviewed patient's old medical records in [facility]  Chief Complaint: sob and chills as well ad continued  abdominal pain and n/v  HPI: Jeff Anderson is a 57 y.o. male with medical history significant of etoh abuse, who initially presented to ED on 12/8  s/p fall  in setting of ETOH use. He was evaluate and found to have right lateral 4th through 6th rib fractures. He was treated and discharged on oral medications. Patient then returns on 12/14 with complaints of n/v/ and constipation. He was evaluated and diagnosed with acute gastroenteritis and admitted to hospital for supportive care. Patient was then discharged on 12/14 with diagnosis of acute gastroenteritis, prolonged QT, constipation and macrocytic anemia.  Patient now returns to ED with complaint of sob/ and chills  as well as continued n/v and abdominal pain . Currently patient states few days ago he noted sob/fever/chills. He presents due to continued symptoms. He noted that he initially thought he was withdrawing from ETOH but when symptoms became severe he decided to seek help. He states his last drink was 4 days ago.   ED Course:  Vitals:afeb, bp 105/66, HR 120 rr21 sat on NC3L Lactic 6.2,4.4 , 2.9 with ivfs  CT chest/abd MPRESSION: 1. Multifocal inflammatory change and free fluid interspersed between loops of colon and small bowel throughout the abdomen, worsened from 12/15 and compatible with gastroenteritis. 2. Fluid-filled esophagus with mucosal hyperenhancement, in addition to gastric mucosal hyperenhancement. This may indicate a combination of gastritis and esophagitis. 3. Small right pleural effusion with basilar atelectasis. 4. Fractures of the right fourth through seventh ribs. 5. Multifocal tree in bud type opacities in the left  lung, consistent with bronchiolitis.   Tx cefepime/vanc Review of Systems: As per HPI otherwise 10 point review of systems negative.   History reviewed. No pertinent past medical history.  Past Surgical History:  Procedure Laterality Date   ANKLE SURGERY Left    ELBOW SURGERY       reports that he has been smoking cigarettes. He has a 45 pack-year smoking history. He has never used smokeless tobacco. He reports current alcohol use of about 3.0 standard drinks of alcohol per week. He reports that he does not currently use drugs.  Allergies  Allergen Reactions   Codeine Nausea And Vomiting    No family history on file.  Prior to Admission medications   Medication Sig Start Date End Date Taking? Authorizing Provider  docusate sodium (COLACE) 100 MG capsule Take 1 capsule (100 mg total) by mouth 2 (two) times daily. 03/20/23 03/19/24  Vassie Loll, MD  folic acid (FOLVITE) 1 MG tablet Take 1 tablet (1 mg total) by mouth daily. 03/20/23   Vassie Loll, MD  oxyCODONE-acetaminophen (PERCOCET/ROXICET) 5-325 MG tablet Take 1-2 tablets by mouth every 6 (six) hours as needed for severe pain (pain score 7-10). 03/11/23   Pollyann Savoy, MD  pantoprazole (PROTONIX) 40 MG tablet Take 1 tablet (40 mg total) by mouth daily. 03/20/23 03/19/24  Vassie Loll, MD  polyethylene glycol (MIRALAX) 17 g packet Take 17 g by mouth daily as needed. 03/20/23   Vassie Loll, MD  prochlorperazine (COMPAZINE) 10 MG tablet Take 1 tablet (10 mg total) by mouth every 6 (six) hours as needed for nausea or vomiting. 03/20/23   Vassie Loll, MD  Physical Exam: Vitals:   03/21/23 2350 03/22/23 0029 03/22/23 0030 03/22/23 0230  BP: 105/66  111/86 (!) 138/90  Pulse: (!) 120  70   Resp: (!) 21  (!) 24 (!) 23  Temp:    97.9 F (36.6 C)  TempSrc:    Oral  SpO2: 95%  93%   Weight:  65.8 kg    Height:  5\' 11"  (1.803 m)      Constitutional: NAD, calm, comfortable Vitals:   03/21/23 2350 03/22/23  0029 03/22/23 0030 03/22/23 0230  BP: 105/66  111/86 (!) 138/90  Pulse: (!) 120  70   Resp: (!) 21  (!) 24 (!) 23  Temp:    97.9 F (36.6 C)  TempSrc:    Oral  SpO2: 95%  93%   Weight:  65.8 kg    Height:  5\' 11"  (1.803 m)     Eyes: PERRL, lids and conjunctivae normal ENMT: Mucous membranes are moist. Posterior pharynx clear of any exudate or lesions.Normal dentition.  Neck: normal, supple, no masses, no thyromegaly Respiratory: + rhonchi,+crackles. Normal respiratory effort. No accessory muscle use.  Cardiovascular: Regular rate and rhythm, no murmurs / rubs / gallops. No extremity edema. 2+ pedal pulses. Abdomen: no tenderness, no masses palpated. No hepatosplenomegaly. Bowel sounds positive.  Musculoskeletal: no clubbing / cyanosis. No joint deformity upper and lower extremities. Good ROM, no contractures. Normal muscle tone.  Skin: no rashes, lesions, ulcers. No induration Neurologic: CN 2-12 grossly intact. Sensation intact, DTR normal. Strength 5/5 in all 4.  Psychiatric: Normal judgment and insight. Alert and oriented x 3. Normal mood.    Labs on Admission: I have personally reviewed following labs and imaging studies  CBC: Recent Labs  Lab 03/18/23 1832 03/19/23 0509 03/21/23 2335  WBC 4.3 4.9 29.5*  NEUTROABS 2.8  --  27.6*  HGB 11.5* 10.5* 14.4  HCT 29.4* 28.8* 39.2  MCV 109.7* 112.5* 118.1*  PLT 205 207 499*   Basic Metabolic Panel: Recent Labs  Lab 03/18/23 1832 03/19/23 0509 03/20/23 0418 03/21/23 2335  NA 133* 134* 135 132*  K 2.2* 2.6* 4.1 3.5  CL 87* 91* 97* 93*  CO2 34* 32 31 24  GLUCOSE 103* 103* 103* 207*  BUN 8 9 8 17   CREATININE 0.62 0.65 0.62 1.07  CALCIUM 8.6* 8.1* 8.4* 8.7*  MG 1.7 1.8  --   --   PHOS  --  3.7  --   --    GFR: Estimated Creatinine Clearance: 70.9 mL/min (by C-G formula based on SCr of 1.07 mg/dL). Liver Function Tests: Recent Labs  Lab 03/18/23 1832 03/19/23 0509 03/21/23 2335  AST 23 17 28   ALT 18 14 19    ALKPHOS 70 62 85  BILITOT 1.2* 1.2* 2.6*  PROT 6.4* 5.5* 6.9  ALBUMIN 3.0* 2.7* 3.3*   Recent Labs  Lab 03/18/23 1832  LIPASE 39   No results for input(s): "AMMONIA" in the last 168 hours. Coagulation Profile: Recent Labs  Lab 03/21/23 2335  INR 1.1   Cardiac Enzymes: No results for input(s): "CKTOTAL", "CKMB", "CKMBINDEX", "TROPONINI" in the last 168 hours. BNP (last 3 results) No results for input(s): "PROBNP" in the last 8760 hours. HbA1C: No results for input(s): "HGBA1C" in the last 72 hours. CBG: No results for input(s): "GLUCAP" in the last 168 hours. Lipid Profile: No results for input(s): "CHOL", "HDL", "LDLCALC", "TRIG", "CHOLHDL", "LDLDIRECT" in the last 72 hours. Thyroid Function Tests: No results for input(s): "TSH", "T4TOTAL", "FREET4", "T3FREE", "THYROIDAB" in  the last 72 hours. Anemia Panel: Recent Labs    03/19/23 0509  VITAMINB12 350  FOLATE 2.2*   Urine analysis:    Component Value Date/Time   COLORURINE YELLOW 03/18/2023 1805   APPEARANCEUR CLEAR 03/18/2023 1805   LABSPEC 1.010 03/18/2023 1805   PHURINE 8.0 03/18/2023 1805   GLUCOSEU NEGATIVE 03/18/2023 1805   HGBUR NEGATIVE 03/18/2023 1805   BILIRUBINUR NEGATIVE 03/18/2023 1805   KETONESUR NEGATIVE 03/18/2023 1805   PROTEINUR NEGATIVE 03/18/2023 1805   UROBILINOGEN 1.0 09/22/2009 1146   NITRITE NEGATIVE 03/18/2023 1805   LEUKOCYTESUR NEGATIVE 03/18/2023 1805    Radiological Exams on Admission: CT CHEST ABDOMEN PELVIS W CONTRAST Result Date: 03/22/2023 CLINICAL DATA:  Sepsis EXAM: CT CHEST, ABDOMEN, AND PELVIS WITH CONTRAST TECHNIQUE: Multidetector CT imaging of the chest, abdomen and pelvis was performed following the standard protocol during bolus administration of intravenous contrast. RADIATION DOSE REDUCTION: This exam was performed according to the departmental dose-optimization program which includes automated exposure control, adjustment of the mA and/or kV according to patient  size and/or use of iterative reconstruction technique. CONTRAST:  OMNIPAQUE IOHEXOL 300 MG/ML  SOLN COMPARISON:  03/18/2023 FINDINGS: CT CHEST FINDINGS Cardiovascular: No significant vascular findings. Normal heart size. No pericardial effusion. Mediastinum/Nodes: No lymphadenopathy. Wall thickening of the distal thoracic esophagus. The esophagus is filled with fluid. Lungs/Pleura: Biapical paraseptal emphysema. Small right pleural effusion with basilar atelectasis. Multifocal old tree in bud type opacities in the left lung. Musculoskeletal: Fractures of the right fourth through seventh ribs. CT ABDOMEN PELVIS FINDINGS Hepatobiliary: Small volume perihepatic ascites. No focal liver lesion. Normal gallbladder. Pancreas: Normal Spleen: Small amount of perisplenic ascites. No focal parenchymal lesion. Adrenals/Urinary Tract: Normal adrenal glands. No hydroureteronephrosis, nephroureterolithiasis or solid renal mass. Stomach/Bowel: There is mild hyperenhancement of the gastric lumen. There is inflammatory change along the transverse colon, greatest near the hepatic flexure. Small volume ascites within the abdomen and extending into the pelvis. Vascular/Lymphatic: Calcific aortic atherosclerosis. No lymphadenopathy. Reproductive: Prostate is unremarkable. Other: No abdominal wall hernia or abnormality. Musculoskeletal: No fracture is seen. IMPRESSION: 1. Multifocal inflammatory change and free fluid interspersed between loops of colon and small bowel throughout the abdomen, worsened from 12/15 and compatible with gastroenteritis. 2. Fluid-filled esophagus with mucosal hyperenhancement, in addition to gastric mucosal hyperenhancement. This may indicate a combination of gastritis and esophagitis. 3. Small right pleural effusion with basilar atelectasis. 4. Fractures of the right fourth through seventh ribs. 5. Multifocal tree in bud type opacities in the left lung, consistent with bronchiolitis. Aortic  Atherosclerosis (ICD10-I70.0) and Emphysema (ICD10-J43.9). Electronically Signed   By: Deatra Robinson M.D.   On: 03/22/2023 02:54   DG Chest Port 1 View Result Date: 03/22/2023 CLINICAL DATA:  Sepsis EXAM: PORTABLE CHEST 1 VIEW COMPARISON:  03/11/2023 FINDINGS: There is developing focal pulmonary opacity within the medial right lung base, in keeping with developing atelectasis or infiltrate. The lungs remain mildly hyperinflated suggesting changes of underlying COPD. No pneumothorax. Small right pleural effusion has developed. Acute fractures of the right 3-7 ribs are again seen laterally. IMPRESSION: 1. Developing focal pulmonary opacity within the medial right lung base, in keeping with developing atelectasis or infiltrate. 2. Interval development of small right pleural effusion. 3. Acute fractures of the right 3-7 ribs laterally. Electronically Signed   By: Helyn Numbers M.D.   On: 03/22/2023 00:17    EKG: Independently reviewed.   Assessment/Plan  HCAP with acute Hypoxic respiratory failure  Sepsis  -admit to progressive care  - place  on broad spectrum abx per protocol  -f/u with culture data  -continue ivfs - pulmonary toilet  - wean O2  as able  -consider pulmonary consult if no improvement in clinical status    Progressive Gastroenteritis -Multifocal inflammatory change and free fluid interspersed between loops of colon and small bowel throughout the abdomen, worsened from 12/15 and compatible with gastroenteritis. -will check stool studies  - supportive care  -on abx for HCAP will be cover Gi path  -f/u abdominal exam  -check fecal calprotectin  -consider Gi consult if symptoms do not improve  Hx of Prolonged QT -EKG QT 490 -continue to monitor on tele  Hx of Macrocytic Anemia POA -continue supplements   ETOH abuse  -per patient last drink close to one week ago  - will placed on ciwa   Constipation  -continue bowel regimen   Hx history of Multiple rib  fractures -supportive care,pulmonary toilet   GERD Ppi  Tobacco abuse  -encourage cessation -outpatient f/u for assistance   DVT prophylaxis: heparin Code Status: full/ as discussed per patient wishes in event of cardiac arrest  Family Communication: none at bedside Disposition Plan: patient  expected to be admitted greater than 2 midnights  Consults called: n/a Admission status: progressive   Lurline Del MD [facility]   If 7PM-7AM, please contact night-coverage [email or web address] Password [email or web address]  03/22/2023, 3:40 AM

## 2023-03-22 NOTE — Progress Notes (Signed)
ED Pharmacy Antibiotic Sign Off An antibiotic consult was received from an ED provider for Cefepime + Vancomycin per pharmacy dosing for sepsis. A chart review was completed to assess appropriateness.   The following one time order(s) were placed:  Cefpeime 2gm IV Vancomycin 1gm IV  Further antibiotic and/or antibiotic pharmacy consults should be ordered by the admitting provider if indicated.   Thank you for allowing pharmacy to be a part of this patient's care.   Junita Push, [facility]  Clinical Pharmacist 03/22/23 1:05 AM

## 2023-03-22 NOTE — Progress Notes (Signed)
[facility] PLAN OF CARE NOTE Patient: Jeff Anderson ZOX:[id number]   PCP: Richardean Chimera, MD DOB: [birth year]   DOA: 03/21/2023   DOS: 03/22/2023    Patient was admitted by my colleague earlier on 03/22/2023. I have reviewed the H&P as well as assessment and plan and agree with the same. Important changes in the plan are listed below.  Plan of care: Principal Problem:   HCAP (healthcare-associated pneumonia)   Continue antibiotic.  Discontinue vancomycin. Monitor.  Level of care: Progressive Continue progressive due to presentation with severe lactic acidosis.  Author: Lynden Oxford, MD  [facility] 03/22/2023 6:43 PM   If 7PM-7AM, please contact night-coverage at [email or web address]

## 2023-03-22 NOTE — ED Provider Notes (Signed)
[facility] AT [facility] Provider Note   CSN: [id number] Arrival date & time: 03/21/23  2301     History  Chief Complaint  Patient presents with   Shaking   Abdominal Pain    Jeff Anderson is a 57 y.o. male.  Presents to the emergency department for evaluation of shortness of breath.  Patient reports he was just hospitalized at any [facility], discharged yesterday.  He was hospitalized at that time with right-sided rib fractures from a fall, nausea and vomiting with abdominal pain.  Patient reports that his abdominal pain and vomiting continues.       Home Medications Prior to Admission medications   Medication Sig Start Date End Date Taking? Authorizing Provider  docusate sodium (COLACE) 100 MG capsule Take 1 capsule (100 mg total) by mouth 2 (two) times daily. 03/20/23 03/19/24  Vassie Loll, MD  folic acid (FOLVITE) 1 MG tablet Take 1 tablet (1 mg total) by mouth daily. 03/20/23   Vassie Loll, MD  oxyCODONE-acetaminophen (PERCOCET/ROXICET) 5-325 MG tablet Take 1-2 tablets by mouth every 6 (six) hours as needed for severe pain (pain score 7-10). 03/11/23   Pollyann Savoy, MD  pantoprazole (PROTONIX) 40 MG tablet Take 1 tablet (40 mg total) by mouth daily. 03/20/23 03/19/24  Vassie Loll, MD  polyethylene glycol (MIRALAX) 17 g packet Take 17 g by mouth daily as needed. 03/20/23   Vassie Loll, MD  prochlorperazine (COMPAZINE) 10 MG tablet Take 1 tablet (10 mg total) by mouth every 6 (six) hours as needed for nausea or vomiting. 03/20/23   Vassie Loll, MD      Allergies    Codeine    Review of Systems   Review of Systems  Physical Exam Updated Vital Signs BP (!) 138/90 (BP Location: Left Arm)   Pulse 70   Temp 97.9 F (36.6 C) (Oral)   Resp (!) 23   Ht 5\' 11"  (1.803 m)   Wt 65.8 kg   SpO2 93%   BMI 20.22 kg/m  Physical Exam Vitals and nursing note reviewed.  Constitutional:      General: He is not in  acute distress.    Appearance: He is well-developed. He is ill-appearing.  HENT:     Head: Normocephalic and atraumatic.     Mouth/Throat:     Mouth: Mucous membranes are moist.  Eyes:     General: Vision grossly intact. Gaze aligned appropriately.     Extraocular Movements: Extraocular movements intact.     Conjunctiva/sclera: Conjunctivae normal.  Cardiovascular:     Rate and Rhythm: Regular rhythm. Tachycardia present.     Pulses: Normal pulses.     Heart sounds: Normal heart sounds, S1 normal and S2 normal. No murmur heard.    No friction rub. No gallop.  Pulmonary:     Effort: Pulmonary effort is normal. Tachypnea present. No respiratory distress.     Breath sounds: Decreased air movement present.  Chest:     Chest wall: Tenderness (right) present.  Abdominal:     Palpations: Abdomen is soft.     Tenderness: There is no abdominal tenderness. There is no guarding or rebound.     Hernia: No hernia is present.  Musculoskeletal:        General: No swelling.     Cervical back: Full passive range of motion without pain, normal range of motion and neck supple. No pain with movement, spinous process tenderness or muscular tenderness. Normal range of motion.  Right lower leg: No edema.     Left lower leg: No edema.  Skin:    General: Skin is warm and dry.     Capillary Refill: Capillary refill takes less than 2 seconds.     Findings: No ecchymosis, erythema, lesion or wound.  Neurological:     Mental Status: He is alert and oriented to person, place, and time.     GCS: GCS eye subscore is 4. GCS verbal subscore is 5. GCS motor subscore is 6.     Cranial Nerves: Cranial nerves 2-12 are intact.     Sensory: Sensation is intact.     Motor: Motor function is intact. No weakness or abnormal muscle tone.     Coordination: Coordination is intact.  Psychiatric:        Mood and Affect: Mood normal.        Speech: Speech normal.        Behavior: Behavior normal.     ED Results /  Procedures / Treatments   Labs (all labs ordered are listed, but only abnormal results are displayed) Labs Reviewed  COMPREHENSIVE METABOLIC PANEL - Abnormal; Notable for the following components:      Result Value   Sodium 132 (*)    Chloride 93 (*)    Glucose, Bld 207 (*)    Calcium 8.7 (*)    Albumin 3.3 (*)    Total Bilirubin 2.6 (*)    All other components within normal limits  CBC WITH DIFFERENTIAL/PLATELET - Abnormal; Notable for the following components:   WBC 29.5 (*)    RBC 3.32 (*)    MCV 118.1 (*)    MCH 43.4 (*)    MCHC 36.7 (*)    Platelets 499 (*)    Neutro Abs 27.6 (*)    Lymphs Abs 0.5 (*)    Monocytes Absolute 1.1 (*)    Abs Immature Granulocytes 0.21 (*)    All other components within normal limits  APTT - Abnormal; Notable for the following components:   aPTT 23 (*)    All other components within normal limits  I-STAT CG4 LACTIC ACID, ED - Abnormal; Notable for the following components:   Lactic Acid, Venous 6.2 (*)    All other components within normal limits  I-STAT CG4 LACTIC ACID, ED - Abnormal; Notable for the following components:   Lactic Acid, Venous 4.4 (*)    All other components within normal limits  I-STAT CG4 LACTIC ACID, ED - Abnormal; Notable for the following components:   Lactic Acid, Venous 2.9 (*)    All other components within normal limits  RESP PANEL BY RT-PCR (RSV, FLU A&B, COVID)  RVPGX2  CULTURE, BLOOD (ROUTINE X 2)  CULTURE, BLOOD (ROUTINE X 2)  PROTIME-INR  URINALYSIS, W/ REFLEX TO CULTURE (INFECTION SUSPECTED)    EKG EKG Interpretation Date/Time:  Wednesday March 21 2023 23:29:16 EST Ventricular Rate:  121 PR Interval:  125 QRS Duration:  79 QT Interval:  345 QTC Calculation: 490 R Axis:   104  Text Interpretation: Sinus tachycardia Ventricular premature complex Aberrant conduction of SV complex(es) LAE, consider biatrial enlargement Consider right ventricular hypertrophy Nonspecific T abnrm, anterolateral leads  Borderline prolonged QT interval Confirmed by Gerhard Munch [id number]) on 03/21/2023 11:36:46 PM  Radiology CT CHEST ABDOMEN PELVIS W CONTRAST Result Date: 03/22/2023 CLINICAL DATA:  Sepsis EXAM: CT CHEST, ABDOMEN, AND PELVIS WITH CONTRAST TECHNIQUE: Multidetector CT imaging of the chest, abdomen and pelvis was performed following the standard protocol during bolus  administration of intravenous contrast. RADIATION DOSE REDUCTION: This exam was performed according to the departmental dose-optimization program which includes automated exposure control, adjustment of the mA and/or kV according to patient size and/or use of iterative reconstruction technique. CONTRAST:  OMNIPAQUE IOHEXOL 300 MG/ML  SOLN COMPARISON:  03/18/2023 FINDINGS: CT CHEST FINDINGS Cardiovascular: No significant vascular findings. Normal heart size. No pericardial effusion. Mediastinum/Nodes: No lymphadenopathy. Wall thickening of the distal thoracic esophagus. The esophagus is filled with fluid. Lungs/Pleura: Biapical paraseptal emphysema. Small right pleural effusion with basilar atelectasis. Multifocal old tree in bud type opacities in the left lung. Musculoskeletal: Fractures of the right fourth through seventh ribs. CT ABDOMEN PELVIS FINDINGS Hepatobiliary: Small volume perihepatic ascites. No focal liver lesion. Normal gallbladder. Pancreas: Normal Spleen: Small amount of perisplenic ascites. No focal parenchymal lesion. Adrenals/Urinary Tract: Normal adrenal glands. No hydroureteronephrosis, nephroureterolithiasis or solid renal mass. Stomach/Bowel: There is mild hyperenhancement of the gastric lumen. There is inflammatory change along the transverse colon, greatest near the hepatic flexure. Small volume ascites within the abdomen and extending into the pelvis. Vascular/Lymphatic: Calcific aortic atherosclerosis. No lymphadenopathy. Reproductive: Prostate is unremarkable. Other: No abdominal wall hernia or abnormality.  Musculoskeletal: No fracture is seen. IMPRESSION: 1. Multifocal inflammatory change and free fluid interspersed between loops of colon and small bowel throughout the abdomen, worsened from 12/15 and compatible with gastroenteritis. 2. Fluid-filled esophagus with mucosal hyperenhancement, in addition to gastric mucosal hyperenhancement. This may indicate a combination of gastritis and esophagitis. 3. Small right pleural effusion with basilar atelectasis. 4. Fractures of the right fourth through seventh ribs. 5. Multifocal tree in bud type opacities in the left lung, consistent with bronchiolitis. Aortic Atherosclerosis (ICD10-I70.0) and Emphysema (ICD10-J43.9). Electronically Signed   By: Deatra Robinson M.D.   On: 03/22/2023 02:54   DG Chest Port 1 View Result Date: 03/22/2023 CLINICAL DATA:  Sepsis EXAM: PORTABLE CHEST 1 VIEW COMPARISON:  03/11/2023 FINDINGS: There is developing focal pulmonary opacity within the medial right lung base, in keeping with developing atelectasis or infiltrate. The lungs remain mildly hyperinflated suggesting changes of underlying COPD. No pneumothorax. Small right pleural effusion has developed. Acute fractures of the right 3-7 ribs are again seen laterally. IMPRESSION: 1. Developing focal pulmonary opacity within the medial right lung base, in keeping with developing atelectasis or infiltrate. 2. Interval development of small right pleural effusion. 3. Acute fractures of the right 3-7 ribs laterally. Electronically Signed   By: Helyn Numbers M.D.   On: 03/22/2023 00:17    Procedures Procedures    Medications Ordered in ED Medications  sodium chloride flush (NS) 0.9 % injection 10 mL (10 mLs Intravenous Given 03/22/23 0038)  pantoprazole (PROTONIX) injection 40 mg (has no administration in time range)  sodium chloride 0.9 % bolus 1,000 mL (0 mLs Intravenous Stopped 03/22/23 0228)    And  sodium chloride 0.9 % bolus 1,000 mL (0 mLs Intravenous Stopped 03/22/23 0228)   vancomycin (VANCOCIN) IVPB 1000 mg/200 mL premix (0 mg Intravenous Stopped 03/22/23 0231)  ceFEPIme (MAXIPIME) 2 g in sodium chloride 0.9 % 100 mL IVPB (0 g Intravenous Stopped 03/22/23 0126)  iohexol (OMNIPAQUE) 300 MG/ML solution 100 mL (100 mLs Intravenous Contrast Given 03/22/23 0158)    ED Course/ Medical Decision Making/ A&P                                 Medical Decision Making Amount and/or Complexity of Data Reviewed External Data  Reviewed: labs, radiology, ECG and notes. Labs: ordered. Decision-making details documented in ED Course. Radiology: ordered and independent interpretation performed. Decision-making details documented in ED Course. ECG/medicine tests: ordered and independent interpretation performed. Decision-making details documented in ED Course.  Risk Prescription drug management. Decision regarding hospitalization.   Differential Diagnosis considered includes, but not limited to: COVID-19; influenza; RSV; simple viral URI; pneumonia ; sepsis  Patient recently hospitalized with acute right-sided rib fractures, nausea, vomiting and abdominal pain.  He was discharged yesterday.  He reports that he has felt poorly since discharge and has worsened.  Patient with acute leukocytosis, lactic acidosis concerning for sepsis.  No hypotension.  Administered broad-spectrum antibiotics to cover for hospital associated pneumonia.  I did review his previous records.  He did have a CAT scan several days ago at time of admission to [facility] that did not show any acute abnormalities.  Patient with a nonfocal abdominal exam.  Patient concerned about withdrawal.  He has not had any alcohol in approximately a week.  He was hospitalized for the last 3 days, not felt to be at risk for DTs at this time.  Clinical chest, abdomen, pelvis performed to further evaluate.  Patient with findings consistent with gastroenteritis and gastritis, no other acute process noted in the abdomen.   Patient with laboratory findings in the chest.  Lactic acid has progressively improved with IV fluids.  CRITICAL CARE Performed by: Gilda Crease   Total critical care time: 35 minutes  Critical care time was exclusive of separately billable procedures and treating other patients.  Critical care was necessary to treat or prevent imminent or life-threatening deterioration.  Critical care was time spent personally by me on the following activities: development of treatment plan with patient and/or surrogate as well as nursing, discussions with consultants, evaluation of patient's response to treatment, examination of patient, obtaining history from patient or surrogate, ordering and performing treatments and interventions, ordering and review of laboratory studies, ordering and review of radiographic studies, pulse oximetry and re-evaluation of patient's condition.         Final Clinical Impression(s) / ED Diagnoses Final diagnoses:  Sepsis, due to unspecified organism, unspecified whether acute organ dysfunction present [facility])    Rx / DC Orders ED Discharge Orders     None         Ainara Eldridge, Canary Brim, MD 03/22/23 [phone]

## 2023-03-22 NOTE — ED Notes (Signed)
ED TO INPATIENT HANDOFF REPORT  ED Nurse Name and Phone #: Rebecca Eaton RN [phone]  S Name/Age/Gender Jeff Anderson 57 y.o. male Room/Bed: WA15/WA15  Code Status   Code Status: Full Code  Home/SNF/Other Home Patient oriented to: self, place, time, and situation Is this baseline? Yes   Triage Complete: Triage complete  Chief Complaint HCAP (healthcare-associated pneumonia) [J18.9]  Triage Note Pt arrives with reports of abdominal pain, emesis, SHOB and shaking. Pt reports he was recently admitted to hospital for hypokalemia and d/c yesterday. Pt reports he feels like he is withdrawing from alcohol, reports last drink 8 days ago.    Allergies Allergies  Allergen Reactions   Codeine Nausea And Vomiting    Level of Care/Admitting Diagnosis ED Disposition     ED Disposition  Admit   Condition  --   Comment  Hospital Area: [facility] [[id number]]  Level of Care: Progressive [102]  Admit to Progressive based on following criteria: RESPIRATORY PROBLEMS hypoxemic/hypercapnic respiratory failure that is responsive to NIPPV (BiPAP) or High Flow Nasal Cannula (6-80 lpm). Frequent assessment/intervention, no > Q2 hrs < Q4 hrs, to maintain oxygenation and pulmonary hygiene.  May admit patient to [facility] or [facility] if equivalent level of care is available:: No  Covid Evaluation: Confirmed COVID Negative  Diagnosis: HCAP (healthcare-associated pneumonia) [981191]  Admitting Physician: Lurline Del [[id number]]  Attending Physician: Lurline Del [[id number]]  Certification:: I certify this patient will need inpatient services for at least 2 midnights  Expected Medical Readiness: 03/27/2023          B Medical/Surgery History History reviewed. No pertinent past medical history. Past Surgical History:  Procedure Laterality Date   ANKLE SURGERY Left    ELBOW SURGERY       A IV Location/Drains/Wounds Patient Lines/Drains/Airways  Status     Active Line/Drains/Airways     Name Placement date Placement time Site Days   Peripheral IV 03/21/23 20 G Right Antecubital 03/21/23  2339  Antecubital  1   Peripheral IV 03/22/23 20 G Anterior;Distal;Left;Upper Arm 03/22/23  0039  Arm  less than 1            Intake/Output Last 24 hours  Intake/Output Summary (Last 24 hours) at 03/22/2023 1940 Last data filed at 03/22/2023 1534 Gross per 24 hour  Intake 2706.19 ml  Output --  Net 2706.19 ml    Labs/Imaging Results for orders placed or performed during the hospital encounter of 03/21/23 (from the past 48 hours)  Comprehensive metabolic panel     Status: Abnormal   Collection Time: 03/21/23 11:35 PM  Result Value Ref Range   Sodium 132 (L) 135 - 145 mmol/L   Potassium 3.5 3.5 - 5.1 mmol/L   Chloride 93 (L) 98 - 111 mmol/L   CO2 24 22 - 32 mmol/L   Glucose, Bld 207 (H) 70 - 99 mg/dL    Comment: Glucose reference range applies only to samples taken after fasting for at least 8 hours.   BUN 17 6 - 20 mg/dL   Creatinine, Ser 7.84 0.61 - 1.24 mg/dL   Calcium 8.7 (L) 8.9 - 10.3 mg/dL   Total Protein 6.9 6.5 - 8.1 g/dL   Albumin 3.3 (L) 3.5 - 5.0 g/dL   AST 28 15 - 41 U/L   ALT 19 0 - 44 U/L   Alkaline Phosphatase 85 38 - 126 U/L   Total Bilirubin 2.6 (H) <1.2 mg/dL   GFR, Estimated >69 >62 mL/min  Comment: (NOTE) Calculated using the CKD-EPI Creatinine Equation (2021)    Anion gap 15 5 - 15    Comment: Performed at [facility], [address], [place], [place] [address]  CBC with Differential     Status: Abnormal   Collection Time: 03/21/23 11:35 PM  Result Value Ref Range   WBC 29.5 (H) 4.0 - 10.5 K/uL   RBC 3.32 (L) 4.22 - 5.81 MIL/uL   Hemoglobin 14.4 13.0 - 17.0 g/dL   HCT 46.9 62.9 - 52.8 %   MCV 118.1 (H) 80.0 - 100.0 fL   MCH 43.4 (H) 26.0 - 34.0 pg   MCHC 36.7 (H) 30.0 - 36.0 g/dL   RDW 41.3 24.4 - 01.0 %   Platelets 499 (H) 150 - 400 K/uL   nRBC 0.0 0.0 - 0.2 %    Neutrophils Relative % 93 %   Neutro Abs 27.6 (H) 1.7 - 7.7 K/uL   Lymphocytes Relative 2 %   Lymphs Abs 0.5 (L) 0.7 - 4.0 K/uL   Monocytes Relative 4 %   Monocytes Absolute 1.1 (H) 0.1 - 1.0 K/uL   Eosinophils Relative 0 %   Eosinophils Absolute 0.0 0.0 - 0.5 K/uL   Basophils Relative 0 %   Basophils Absolute 0.1 0.0 - 0.1 K/uL   Immature Granulocytes 1 %   Abs Immature Granulocytes 0.21 (H) 0.00 - 0.07 K/uL    Comment: Performed at [facility], [address] [place], [place] [address]  Protime-INR     Status: None   Collection Time: 03/21/23 11:35 PM  Result Value Ref Range   Prothrombin Time 14.5 11.4 - 15.2 seconds   INR 1.1 0.8 - 1.2    Comment: (NOTE) INR goal varies based on device and disease states. Performed at [facility], [address] [place], [place] [address]   APTT     Status: Abnormal   Collection Time: 03/21/23 11:35 PM  Result Value Ref Range   aPTT 23 (L) 24 - 36 seconds    Comment: Performed at [facility], [address] [place], [place] [address]  Blood Culture (routine x 2)     Status: None (Preliminary result)   Collection Time: 03/21/23 11:35 PM   Specimen: BLOOD  Result Value Ref Range   Specimen Description      BLOOD BLOOD RIGHT ARM Performed at [facility], [address], [place], [place] [address]    Special Requests      BOTTLES DRAWN AEROBIC AND ANAEROBIC Blood Culture results may not be optimal due to an inadequate volume of blood received in culture bottles Performed at [facility], [address], [place], [place] [address]    Culture      NO GROWTH < 12 HOURS Performed at [facility], [address] [place], [place] [address]    Report Status PENDING   Resp panel by RT-PCR (RSV, Flu A&B, Covid) Anterior Nasal Swab     Status: None   Collection Time: 03/21/23 11:36 PM   Specimen: Anterior Nasal Swab  Result Value Ref  Range   SARS Coronavirus 2 by RT PCR NEGATIVE NEGATIVE    Comment: (NOTE) SARS-CoV-2 target nucleic acids are NOT DETECTED.  The SARS-CoV-2 RNA is generally detectable in upper respiratory specimens during the acute phase of infection. The lowest concentration of SARS-CoV-2 viral copies this assay can detect is 138 copies/mL. A negative result does not preclude SARS-Cov-2  infection and should not be used as the sole basis for treatment or other patient management decisions. A negative result may occur with  improper specimen collection/handling, submission of specimen other than nasopharyngeal swab, presence of viral mutation(s) within the areas targeted by this assay, and inadequate number of viral copies(<138 copies/mL). A negative result Anderson be combined with clinical observations, patient history, and epidemiological information. The expected result is Negative.  Fact Sheet for Patients:  BloggerCourse.com  Fact Sheet for Healthcare Providers:  SeriousBroker.it  This test is no t yet approved or cleared by the [place] FDA and  has been authorized for detection and/or diagnosis of SARS-CoV-2 by FDA under an Emergency Use Authorization (EUA). This EUA will remain  in effect (meaning this test can be used) for the duration of the COVID-19 declaration under Section 564(b)(1) of the Act, 21 U.S.C.section 360bbb-3(b)(1), unless the authorization is terminated  or revoked sooner.       Influenza A by PCR NEGATIVE NEGATIVE   Influenza B by PCR NEGATIVE NEGATIVE    Comment: (NOTE) The Xpert Xpress SARS-CoV-2/FLU/RSV plus assay is intended as an aid in the diagnosis of influenza from Nasopharyngeal swab specimens and should not be used as a sole basis for treatment. Nasal washings and aspirates are unacceptable for Xpert Xpress SARS-CoV-2/FLU/RSV testing.  Fact Sheet for  Patients: BloggerCourse.com  Fact Sheet for Healthcare Providers: SeriousBroker.it  This test is not yet approved or cleared by the [place] FDA and has been authorized for detection and/or diagnosis of SARS-CoV-2 by FDA under an Emergency Use Authorization (EUA). This EUA will remain in effect (meaning this test can be used) for the duration of the COVID-19 declaration under Section 564(b)(1) of the Act, 21 U.S.C. section 360bbb-3(b)(1), unless the authorization is terminated or revoked.     Resp Syncytial Virus by PCR NEGATIVE NEGATIVE    Comment: (NOTE) Fact Sheet for Patients: BloggerCourse.com  Fact Sheet for Healthcare Providers: SeriousBroker.it  This test is not yet approved or cleared by the [place] FDA and has been authorized for detection and/or diagnosis of SARS-CoV-2 by FDA under an Emergency Use Authorization (EUA). This EUA will remain in effect (meaning this test can be used) for the duration of the COVID-19 declaration under Section 564(b)(1) of the Act, 21 U.S.C. section 360bbb-3(b)(1), unless the authorization is terminated or revoked.  Performed at [facility], [address] [place], [place] [address]   I-Stat Lactic Acid, ED     Status: Abnormal   Collection Time: 03/22/23 12:10 AM  Result Value Ref Range   Lactic Acid, Venous 6.2 (HH) 0.5 - 1.9 mmol/L   Comment NOTIFIED PHYSICIAN   Blood Culture (routine x 2)     Status: None (Preliminary result)   Collection Time: 03/22/23 12:28 AM   Specimen: BLOOD  Result Value Ref Range   Specimen Description      BLOOD LEFT ANTECUBITAL Performed at [facility], [address] [place], [place] [address]    Special Requests      BOTTLES DRAWN AEROBIC AND ANAEROBIC Blood Culture adequate volume Performed at [facility], [address]  [place], [place] [address]    Culture      NO GROWTH < 12 HOURS Performed at [facility], [address], [place], [place] [address]    Report Status PENDING   I-Stat Lactic Acid, ED     Status: Abnormal   Collection Time: 03/22/23 12:54 AM  Result  Value Ref Range   Lactic Acid, Venous 4.4 (HH) 0.5 - 1.9 mmol/L   Comment NOTIFIED PHYSICIAN   I-Stat CG4 Lactic Acid, ED     Status: Abnormal   Collection Time: 03/22/23  2:39 AM  Result Value Ref Range   Lactic Acid, Venous 2.9 (HH) 0.5 - 1.9 mmol/L   Comment NOTIFIED PHYSICIAN   Urinalysis, w/ Reflex to Culture (Infection Suspected) -Urine, Clean Catch     Status: Abnormal   Collection Time: 03/22/23  3:17 AM  Result Value Ref Range   Specimen Source URINE, CLEAN CATCH    Color, Urine AMBER (A) YELLOW    Comment: BIOCHEMICALS MAY BE AFFECTED BY COLOR   APPearance HAZY (A) CLEAR   Specific Gravity, Urine >1.046 (H) 1.005 - 1.030   pH 5.0 5.0 - 8.0   Glucose, UA 50 (A) NEGATIVE mg/dL   Hgb urine dipstick SMALL (A) NEGATIVE   Bilirubin Urine NEGATIVE NEGATIVE   Ketones, ur NEGATIVE NEGATIVE mg/dL   Protein, ur 30 (A) NEGATIVE mg/dL   Nitrite NEGATIVE NEGATIVE   Leukocytes,Ua NEGATIVE NEGATIVE   RBC / HPF 0-5 0 - 5 RBC/hpf   WBC, UA 0-5 0 - 5 WBC/hpf    Comment:        Reflex urine culture not performed if WBC <=10, OR if Squamous epithelial cells >5. If Squamous epithelial cells >5 suggest recollection.    Bacteria, UA NONE SEEN NONE SEEN   Squamous Epithelial / HPF 0-5 0 - 5 /HPF   Mucus PRESENT    Hyaline Casts, UA PRESENT     Comment: Performed at [facility], [address], , [place] [address]  Strep pneumoniae urinary antigen     Status: None   Collection Time: 03/22/23  3:17 AM  Result Value Ref Range   Strep Pneumo Urinary Antigen NEGATIVE NEGATIVE    Comment:        Infection due to S. pneumoniae cannot be absolutely ruled out since the antigen present may be below the detection  limit of the test. Performed at [facility], [address], [place], [place] [address]   CBC     Status: Abnormal   Collection Time: 03/22/23  6:35 AM  Result Value Ref Range   WBC 22.2 (H) 4.0 - 10.5 K/uL   RBC 2.91 (L) 4.22 - 5.81 MIL/uL   Hemoglobin 12.6 (L) 13.0 - 17.0 g/dL   HCT 36.6 (L) 44.0 - 34.7 %   MCV 118.2 (H) 80.0 - 100.0 fL   MCH 43.3 (H) 26.0 - 34.0 pg   MCHC 36.6 (H) 30.0 - 36.0 g/dL   RDW 42.5 95.6 - 38.7 %   Platelets 350 150 - 400 K/uL   nRBC 0.0 0.0 - 0.2 %    Comment: Performed at [facility], [address], [place], [place] [address]  Creatinine, serum     Status: None   Collection Time: 03/22/23  6:35 AM  Result Value Ref Range   Creatinine, Ser 1.01 0.61 - 1.24 mg/dL   GFR, Estimated >29 >51 mL/min    Comment: (NOTE) Calculated using the CKD-EPI Creatinine Equation (2021) Performed at [facility], [address], [place], [place] [address]   TSH     Status: None   Collection Time: 03/22/23  6:35 AM  Result Value Ref Range   TSH 1.273 0.350 - 4.500 uIU/mL    Comment: Performed by a 3rd Generation assay with a functional sensitivity  of <=0.01 uIU/mL. Performed at [facility], [address], [place], [place] [address]   Hemoglobin A1c     Status: Abnormal   Collection Time: 03/22/23  6:35 AM  Result Value Ref Range   Hgb A1c MFr Bld 3.9 (L) 4.8 - 5.6 %    Comment: (NOTE) Pre diabetes:          5.7%-6.4%  Diabetes:              >6.4%  Glycemic control for   <7.0% adults with diabetes    Mean Plasma Glucose 65.23 mg/dL    Comment: Performed at [facility], [address], [place], [place] [address]  Lactic acid, plasma     Status: None   Collection Time: 03/22/23  6:35 AM  Result Value Ref Range   Lactic Acid, Venous 1.9 0.5 - 1.9 mmol/L    Comment: Performed at [facility], [address] [place], [place] [address]  Procalcitonin     Status: None    Collection Time: 03/22/23  6:35 AM  Result Value Ref Range   Procalcitonin 21.85 ng/mL    Comment:        Interpretation: PCT >= 10 ng/mL: Important systemic inflammatory response, almost exclusively due to severe bacterial sepsis or septic shock. (NOTE)       Sepsis PCT Algorithm           Lower Respiratory Tract                                      Infection PCT Algorithm    ----------------------------     ----------------------------         PCT < 0.25 ng/mL                PCT < 0.10 ng/mL          Strongly encourage             Strongly discourage   discontinuation of antibiotics    initiation of antibiotics    ----------------------------     -----------------------------       PCT 0.25 - 0.50 ng/mL            PCT 0.10 - 0.25 ng/mL               OR       >80% decrease in PCT            Discourage initiation of                                            antibiotics      Encourage discontinuation           of antibiotics    ----------------------------     -----------------------------         PCT >= 0.50 ng/mL              PCT 0.26 - 0.50 ng/mL                AND       <80% decrease in PCT             Encourage initiation of  antibiotics       Encourage continuation           of antibiotics    ----------------------------     -----------------------------        PCT >= 0.50 ng/mL                  PCT > 0.50 ng/mL               AND         increase in PCT                  Strongly encourage                                      initiation of antibiotics    Strongly encourage escalation           of antibiotics                                     -----------------------------                                           PCT <= 0.25 ng/mL                                                 OR                                        > 80% decrease in PCT                                      Discontinue / Do not initiate                                              antibiotics  Performed at [facility], [address] [place], [place] [address]   C-reactive protein     Status: Abnormal   Collection Time: 03/22/23  6:35 AM  Result Value Ref Range   CRP 9.8 (H) <1.0 mg/dL    Comment: Performed at [facility], [address] [place], [place] [address]  Expectorated Sputum Assessment w Gram Stain, Rflx to Resp Cult     Status: None   Collection Time: 03/22/23  6:39 AM   Specimen: Sputum  Result Value Ref Range   Specimen Description SPUTUM    Special Requests SPUTUM    Sputum evaluation      THIS SPECIMEN IS ACCEPTABLE FOR SPUTUM CULTURE Performed at [facility], [address], [place], [place] [address]    Report Status 03/22/2023 FINAL   Culture, Respiratory w Gram Stain     Status: None (Preliminary result)   Collection Time: 03/22/23  6:39 AM   Specimen: SPU  Result Value Ref Range   Specimen Description      SPUTUM Performed at [facility], [address], [place], [place] [address]    Special Requests      SPUTUM Reflexed from [id number] Performed at [facility], [address] [place], [place] [address]    Gram Stain      RARE WBC PRESENT, PREDOMINANTLY PMN FEW GRAM POSITIVE COCCI IN PAIRS IN CLUSTERS FEW GRAM NEGATIVE RODS Performed at [facility], [address], [place], [place] [address]    Culture PENDING    Report Status PENDING   Lactic acid, plasma     Status: None   Collection Time: 03/22/23 10:14 AM  Result Value Ref Range   Lactic Acid, Venous 1.8 0.5 - 1.9 mmol/L    Comment: Performed at [facility], [address], [place], [place] [address]   CT CHEST ABDOMEN PELVIS W CONTRAST Result Date: 03/22/2023 CLINICAL DATA:  Sepsis EXAM: CT CHEST, ABDOMEN, AND PELVIS WITH CONTRAST TECHNIQUE: Multidetector CT imaging of the chest, abdomen and pelvis was performed following the  standard protocol during bolus administration of intravenous contrast. RADIATION DOSE REDUCTION: This exam was performed according to the departmental dose-optimization program which includes automated exposure control, adjustment of the mA and/or kV according to patient size and/or use of iterative reconstruction technique. CONTRAST:  OMNIPAQUE IOHEXOL 300 MG/ML  SOLN COMPARISON:  03/18/2023 FINDINGS: CT CHEST FINDINGS Cardiovascular: No significant vascular findings. Normal heart size. No pericardial effusion. Mediastinum/Nodes: No lymphadenopathy. Wall thickening of the distal thoracic esophagus. The esophagus is filled with fluid. Lungs/Pleura: Biapical paraseptal emphysema. Small right pleural effusion with basilar atelectasis. Multifocal old tree in bud type opacities in the left lung. Musculoskeletal: Fractures of the right fourth through seventh ribs. CT ABDOMEN PELVIS FINDINGS Hepatobiliary: Small volume perihepatic ascites. No focal liver lesion. Normal gallbladder. Pancreas: Normal Spleen: Small amount of perisplenic ascites. No focal parenchymal lesion. Adrenals/Urinary Tract: Normal adrenal glands. No hydroureteronephrosis, nephroureterolithiasis or solid renal mass. Stomach/Bowel: There is mild hyperenhancement of the gastric lumen. There is inflammatory change along the transverse colon, greatest near the hepatic flexure. Small volume ascites within the abdomen and extending into the pelvis. Vascular/Lymphatic: Calcific aortic atherosclerosis. No lymphadenopathy. Reproductive: Prostate is unremarkable. Other: No abdominal wall hernia or abnormality. Musculoskeletal: No fracture is seen. IMPRESSION: 1. Multifocal inflammatory change and free fluid interspersed between loops of colon and small bowel throughout the abdomen, worsened from 12/15 and compatible with gastroenteritis. 2. Fluid-filled esophagus with mucosal hyperenhancement, in addition to gastric mucosal hyperenhancement. This may  indicate a combination of gastritis and esophagitis. 3. Small right pleural effusion with basilar atelectasis. 4. Fractures of the right fourth through seventh ribs. 5. Multifocal tree in bud type opacities in the left lung, consistent with bronchiolitis. Aortic Atherosclerosis (ICD10-I70.0) and Emphysema (ICD10-J43.9). Electronically Signed   By: Deatra Robinson M.D.   On: 03/22/2023 02:54   DG Chest Port 1 View Result Date: 03/22/2023 CLINICAL DATA:  Sepsis EXAM: PORTABLE CHEST 1 VIEW COMPARISON:  03/11/2023 FINDINGS: There is developing focal pulmonary opacity within the medial right lung base, in keeping with developing atelectasis or infiltrate. The lungs remain mildly hyperinflated suggesting changes of underlying COPD. No pneumothorax. Small right pleural effusion has developed. Acute fractures of the right 3-7 ribs are again seen laterally. IMPRESSION: 1. Developing focal pulmonary opacity within the medial right lung base, in keeping with developing atelectasis or infiltrate. 2. Interval development of small right pleural effusion.  3. Acute fractures of the right 3-7 ribs laterally. Electronically Signed   By: Helyn Numbers M.D.   On: 03/22/2023 00:17    Pending Labs Unresulted Labs (From admission, onward)     Start     Ordered   03/23/23 0500  CBC  Tomorrow morning,   R        03/22/23 0543   03/23/23 0500  Comprehensive metabolic panel  Tomorrow morning,   R        03/22/23 0543   03/22/23 0535  Legionella Pneumophila Serogp 1 Ur Ag  (COPD / Pneumonia / Cellulitis / Lower Extremity Wound)  Once,   R        03/22/23 0543            Vitals/Pain Today's Vitals   03/22/23 1530 03/22/23 1730 03/22/23 1852 03/22/23 1930  BP: 114/72 121/74  (!) 113/103  Pulse: 80 80  78  Resp: (!) 26 (!) 22  (!) 21  Temp:   98.2 F (36.8 C)   TempSrc:   Oral   SpO2: 96% 96%  100%  Weight:      Height:      PainSc:        Isolation Precautions No active  isolations  Medications Medications  sodium chloride flush (NS) 0.9 % injection 10 mL (10 mLs Intravenous Given 03/22/23 1000)  heparin injection 5,000 Units (5,000 Units Subcutaneous Given 03/22/23 1330)  0.9 %  sodium chloride infusion ( Intravenous New Bag/Given 03/22/23 0626)  acetaminophen (TYLENOL) tablet 650 mg (has no administration in time range)    Or  acetaminophen (TYLENOL) suppository 650 mg (has no administration in time range)  oxyCODONE (Oxy IR/ROXICODONE) immediate release tablet 5 mg (has no administration in time range)  albuterol (PROVENTIL) (2.5 MG/3ML) 0.083% nebulizer solution 2.5 mg (has no administration in time range)  guaiFENesin (MUCINEX) 12 hr tablet 600 mg (has no administration in time range)  ceFEPIme (MAXIPIME) 2 g in sodium chloride 0.9 % 100 mL IVPB (2 g Intravenous New Bag/Given 03/22/23 1529)  nicotine (NICODERM CQ - dosed in mg/24 hours) patch 21 mg (21 mg Transdermal Patch Applied 03/22/23 1901)  doxycycline (VIBRA-TABS) tablet 100 mg (has no administration in time range)  sodium chloride 0.9 % bolus 1,000 mL (0 mLs Intravenous Stopped 03/22/23 0228)    And  sodium chloride 0.9 % bolus 1,000 mL (0 mLs Intravenous Stopped 03/22/23 0228)  vancomycin (VANCOCIN) IVPB 1000 mg/200 mL premix (0 mg Intravenous Stopped 03/22/23 0231)  ceFEPIme (MAXIPIME) 2 g in sodium chloride 0.9 % 100 mL IVPB (0 g Intravenous Stopped 03/22/23 0126)  iohexol (OMNIPAQUE) 300 MG/ML solution 100 mL (100 mLs Intravenous Contrast Given 03/22/23 0158)  pantoprazole (PROTONIX) injection 40 mg (40 mg Intravenous Given 03/22/23 0313)  ondansetron (ZOFRAN) injection 4 mg (4 mg Intravenous Given 03/22/23 0312)  metoCLOPramide (REGLAN) injection 10 mg (10 mg Intravenous Given 03/22/23 9811)    Mobility walks     Focused Assessments     R Recommendations: See Admitting Provider Note  Report given to:   Additional Notes:

## 2023-03-23 LAB — COMPREHENSIVE METABOLIC PANEL
ALT: 15 U/L (ref 0–44)
AST: 17 U/L (ref 15–41)
Albumin: 2.2 g/dL — ABNORMAL LOW (ref 3.5–5.0)
Alkaline Phosphatase: 51 U/L (ref 38–126)
Anion gap: 8 (ref 5–15)
BUN: 23 mg/dL — ABNORMAL HIGH (ref 6–20)
CO2: 23 mmol/L (ref 22–32)
Calcium: 7.9 mg/dL — ABNORMAL LOW (ref 8.9–10.3)
Chloride: 102 mmol/L (ref 98–111)
Creatinine, Ser: 0.68 mg/dL (ref 0.61–1.24)
GFR, Estimated: >60 mL/min (ref >60)
Glucose, Bld: 91 mg/dL (ref 70–99)
Potassium: 3.8 mmol/L (ref 3.5–5.1)
Sodium: 133 mmol/L — ABNORMAL LOW (ref 135–145)
Total Bilirubin: 2 mg/dL — ABNORMAL HIGH (ref <1.2)
Total Protein: 5.4 g/dL — ABNORMAL LOW (ref 6.5–8.1)

## 2023-03-23 LAB — CBC
HCT: 29.1 % — ABNORMAL LOW (ref 39.0–52.0)
Hemoglobin: 10.1 g/dL — ABNORMAL LOW (ref 13.0–17.0)
MCH: 42.4 pg — ABNORMAL HIGH (ref 26.0–34.0)
MCHC: 34.7 g/dL (ref 30.0–36.0)
MCV: 122.3 fL — ABNORMAL HIGH (ref 80.0–100.0)
Platelets: 340 10*3/uL (ref 150–400)
RBC: 2.38 MIL/uL — ABNORMAL LOW (ref 4.22–5.81)
RDW: 14.7 % (ref 11.5–15.5)
WBC: 17 10*3/uL — ABNORMAL HIGH (ref 4.0–10.5)
nRBC: 0 % (ref 0.0–0.2)

## 2023-03-23 MED ORDER — SUCRALFATE 1 GM/10ML PO SUSP
1.0000 g | Freq: Two times a day (BID) | ORAL | Status: DC
  Administered 2023-03-23 – 2023-03-25 (×5): 1 g via ORAL
  Filled 2023-03-23 (×5): qty 10

## 2023-03-23 MED ORDER — PANTOPRAZOLE SODIUM 40 MG PO TBEC
40.0000 mg | DELAYED_RELEASE_TABLET | Freq: Two times a day (BID) | ORAL | Status: DC
  Administered 2023-03-23 – 2023-03-25 (×4): 40 mg via ORAL
  Filled 2023-03-23 (×4): qty 1

## 2023-03-23 MED ORDER — SODIUM CHLORIDE 0.9 % IV SOLN
3.0000 g | Freq: Four times a day (QID) | INTRAVENOUS | Status: DC
  Administered 2023-03-23 – 2023-03-25 (×8): 3 g via INTRAVENOUS
  Filled 2023-03-23 (×9): qty 8

## 2023-03-23 MED ORDER — PROCHLORPERAZINE EDISYLATE 10 MG/2ML IJ SOLN: 10.0000 mg | Freq: Four times a day (QID) | INTRAMUSCULAR | Status: DC | PRN

## 2023-03-23 NOTE — Plan of Care (Signed)
  Problem: Clinical Measurements: Goal: Respiratory complications will improve Outcome: Progressing Goal: Cardiovascular complication will be avoided Outcome: Progressing   Problem: Activity: Goal: Risk for activity intolerance will decrease Outcome: Progressing   Problem: Nutrition: Goal: Adequate nutrition will be maintained Outcome: Progressing   Problem: Coping: Goal: Level of anxiety will decrease Outcome: Progressing   Problem: Elimination: Goal: Will not experience complications related to bowel motility Outcome: Progressing Goal: Will not experience complications related to urinary retention Outcome: Progressing   Problem: Pain Management: Goal: General experience of comfort will improve Outcome: Progressing   Problem: Safety: Goal: Ability to remain free from injury will improve Outcome: Progressing   Problem: Skin Integrity: Goal: Risk for impaired skin integrity will decrease Outcome: Progressing

## 2023-03-23 NOTE — Progress Notes (Signed)
Pharmacy Antibiotic Note  Jeff Anderson is a 57 y.o. male admitted on 03/21/2023 with  aspiration PNA .  Pharmacy has been consulted for Unasyn dosing.  Plan: Unasyn 3g IV q6 Will sign off  Height: 5\' 11"  (180.3 cm) Weight: 65.8 kg (145 lb) IBW/kg (Calculated) : 75.3  Temp (24hrs), Avg:98.5 F (36.9 C), Min:98.2 F (36.8 C), Max:98.7 F (37.1 C)  Recent Labs  Lab 03/18/23 1832 03/19/23 0509 03/20/23 0418 03/21/23 2335 03/22/23 0010 03/22/23 0054 03/22/23 0239 03/22/23 0635 03/22/23 1014 03/23/23 0357  WBC 4.3 4.9  --  29.5*  --   --   --  22.2*  --  17.0*  CREATININE 0.62 0.65 0.62 1.07  --   --   --  1.01  --  0.68  LATICACIDVEN  --   --   --   --  6.2* 4.4* 2.9* 1.9 1.8  --     Estimated Creatinine Clearance: 94.8 mL/min (by C-G formula based on SCr of 0.68 mg/dL).    Allergies  Allergen Reactions   Codeine Nausea And Vomiting      Thank you for allowing pharmacy to be a part of this patient's care.  Berkley Harvey 03/23/2023 11:35 AM

## 2023-03-23 NOTE — TOC CM/SW Note (Signed)
Transition of Care [facility]) - Inpatient Brief Assessment   Patient Details  Name: Jeff Anderson MRN: [id number] Date of Birth: 07-04-1965  Transition of [facility]) CM/SW Contact:    Larrie Kass, LCSW Phone Number: 03/23/2023, 3:16 PM   Transition of Care Asessment: Insurance and Status: Insurance coverage has been reviewed Patient has primary care physician: Yes Home environment has been reviewed: home with spouse Prior level of function:: independent Prior/Current Home Services: No current home services Social Drivers of Health Review: SDOH reviewed no interventions necessary Readmission risk has been reviewed: Yes Transition of care needs: no transition of care needs at this time

## 2023-03-23 NOTE — Progress Notes (Signed)
[facility] Progress Note Patient: Jeff Anderson [id number]:[id number] DOB: [birth year] DOA: 03/21/2023  DOS: the patient was seen and examined on 03/23/2023  Brief Hospital Course: PMH of etoh abuse. Presented with complaints of shortness of breath and abdominal pain. Found to have pneumonia. Currently being treated with antibiotics.  Assessment and Plan: Sepsis due to pneumonia, likely aspiration rather than healthcare associated. On IV antibiotics. Will switch to Unasyn. Follow-up on cultures. Monitor and improvement in respiratory status. Met SIRS criteria on admission.  Gastroenteritis. Appears to be worsening on the CT scan although clinically improving. Will monitor for toleration of oral diet.  History of prolonged QTc. Will monitor.  Macrocytic anemia In the setting of alcohol abuse Monitor.  Alcohol abuse potential.  No evidence of withdrawal. Monitor.   Subjective: No nausea no vomiting no fever no chills.  No chest pain.  Physical Exam: General: in Mild distress, No Rash Cardiovascular: S1 and S2 Present, No Murmur Respiratory: Good respiratory effort, Bilateral Air entry present.  Basal crackles, No wheezes Abdomen: Bowel Sound present, No tenderness Extremities: No edema Neuro: Alert and oriented x3, no new focal deficit  Data Reviewed: I have Reviewed nursing notes, Vitals, and Lab results. Since last encounter, pertinent lab results CBC and BMP   . I have ordered test including CBC and BMP  .  Disposition: Status is: Inpatient Remains inpatient appropriate because: Monitor for improvement in respiratory status  heparin injection 5,000 Units Start: 03/22/23 0600   Family Communication: No one at bedside Level of care: Progressive   Vitals:   03/23/23 0059 03/23/23 0500 03/23/23 1049 03/23/23 1336  BP: 105/74 113/64 112/72 119/83  Pulse: 92 86 84 87  Resp:   16 16  Temp: 98.6 F (37 C) 98.5 F (36.9 C) 98.7 F (37.1 C) 98.4 F (36.9 C)   TempSrc: Oral Oral Oral Oral  SpO2: 99%  98%   Weight:      Height:         Author: Lynden Oxford, MD 03/23/2023 7:06 PM  Please look on [email or web address] to find out who is on call.

## 2023-03-23 NOTE — Progress Notes (Signed)
Mobility Specialist - Progress Note  Pre-mobility: 87 bpm HR,  During mobility: 127 bpm HR, Post-mobility: 107 bpm HR,   03/23/23 1348  Mobility  Activity Ambulated independently in hallway  Level of Assistance Independent  Assistive Device None  Distance Ambulated (ft) 350 ft  Range of Motion/Exercises Active  Activity Response Tolerated well  Mobility Referral Yes  Mobility visit 1 Mobility  Mobility Specialist Start Time (ACUTE ONLY) 1335  Mobility Specialist Stop Time (ACUTE ONLY) 1348  Mobility Specialist Time Calculation (min) (ACUTE ONLY) 13 min   Pt was found in bed and agreeable to ambulate. No complaints with session. At EOS returned to bed with all needs met. Call bell in reach.  Billey Chang Mobility Specialist

## 2023-03-23 NOTE — Hospital Course (Signed)
PMH of etoh abuse. Presented with complaints of shortness of breath and abdominal pain. Found to have pneumonia. Currently being treated with antibiotics.  Assessment and Plan: Sepsis due to pneumonia, likely aspiration rather than healthcare associated. On IV antibiotics. Will switch to Unasyn. Follow-up on cultures. Monitor and improvement in respiratory status. Met SIRS criteria on admission.  Gastroenteritis. Appears to be worsening on the CT scan although clinically improving. Will monitor for toleration of oral diet.  History of prolonged QTc. Will monitor.  Macrocytic anemia In the setting of alcohol abuse Monitor.  Alcohol abuse potential.  No evidence of withdrawal. Monitor.

## 2023-03-24 LAB — BASIC METABOLIC PANEL
Anion gap: 5 (ref 5–15)
Anion gap: 8 (ref 5–15)
BUN: 18 mg/dL (ref 6–20)
BUN: 16 mg/dL (ref 6–20)
CO2: 24 mmol/L (ref 22–32)
CO2: 23 mmol/L (ref 22–32)
Calcium: 7.6 mg/dL — ABNORMAL LOW (ref 8.9–10.3)
Calcium: 7.6 mg/dL — ABNORMAL LOW (ref 8.9–10.3)
Chloride: 100 mmol/L (ref 98–111)
Chloride: 97 mmol/L — ABNORMAL LOW (ref 98–111)
Creatinine, Ser: 0.71 mg/dL (ref 0.61–1.24)
Creatinine, Ser: 0.51 mg/dL — ABNORMAL LOW (ref 0.61–1.24)
GFR, Estimated: >60 mL/min (ref >60)
GFR, Estimated: >60 mL/min (ref >60)
Glucose, Bld: 103 mg/dL — ABNORMAL HIGH (ref 70–99)
Glucose, Bld: 102 mg/dL — ABNORMAL HIGH (ref 70–99)
Potassium: 3.5 mmol/L (ref 3.5–5.1)
Potassium: 3 mmol/L — ABNORMAL LOW (ref 3.5–5.1)
Sodium: 129 mmol/L — ABNORMAL LOW (ref 135–145)
Sodium: 128 mmol/L — ABNORMAL LOW (ref 135–145)

## 2023-03-24 LAB — CBC
HCT: 24.7 % — ABNORMAL LOW (ref 39.0–52.0)
Hemoglobin: 8.7 g/dL — ABNORMAL LOW (ref 13.0–17.0)
MCH: 42.2 pg — ABNORMAL HIGH (ref 26.0–34.0)
MCHC: 35.2 g/dL (ref 30.0–36.0)
MCV: 119.9 fL — ABNORMAL HIGH (ref 80.0–100.0)
Platelets: 398 10*3/uL (ref 150–400)
RBC: 2.06 MIL/uL — ABNORMAL LOW (ref 4.22–5.81)
RDW: 14.6 % (ref 11.5–15.5)
WBC: 15 10*3/uL — ABNORMAL HIGH (ref 4.0–10.5)
nRBC: 0 % (ref 0.0–0.2)

## 2023-03-24 LAB — CBC WITH DIFFERENTIAL/PLATELET
Abs Immature Granulocytes: 0.26 10*3/uL — ABNORMAL HIGH (ref 0.00–0.07)
Basophils Absolute: 0 10*3/uL (ref 0.0–0.1)
Basophils Relative: 0 %
Eosinophils Absolute: 0 10*3/uL (ref 0.0–0.5)
Eosinophils Relative: 0 %
HCT: 26.6 % — ABNORMAL LOW (ref 39.0–52.0)
Hemoglobin: 9.1 g/dL — ABNORMAL LOW (ref 13.0–17.0)
Immature Granulocytes: 2 %
Lymphocytes Relative: 2 %
Lymphs Abs: 0.3 10*3/uL — ABNORMAL LOW (ref 0.7–4.0)
MCH: 41.2 pg — ABNORMAL HIGH (ref 26.0–34.0)
MCHC: 34.2 g/dL (ref 30.0–36.0)
MCV: 120.4 fL — ABNORMAL HIGH (ref 80.0–100.0)
Monocytes Absolute: 0.8 10*3/uL (ref 0.1–1.0)
Monocytes Relative: 6 %
Neutro Abs: 12.4 10*3/uL — ABNORMAL HIGH (ref 1.7–7.7)
Neutrophils Relative %: 90 %
Platelets: 429 10*3/uL — ABNORMAL HIGH (ref 150–400)
RBC: 2.21 MIL/uL — ABNORMAL LOW (ref 4.22–5.81)
RDW: 14.6 % (ref 11.5–15.5)
WBC: 13.8 10*3/uL — ABNORMAL HIGH (ref 4.0–10.5)
nRBC: 0 % (ref 0.0–0.2)

## 2023-03-24 LAB — LACTATE DEHYDROGENASE: LDH: 105 U/L (ref 98–192)

## 2023-03-24 LAB — RETICULOCYTES
Immature Retic Fract: 13 % (ref 2.3–15.9)
RBC.: 2.2 MIL/uL — ABNORMAL LOW (ref 4.22–5.81)
Retic Count, Absolute: 85.6 10*3/uL (ref 19.0–186.0)
Retic Ct Pct: 3.9 % — ABNORMAL HIGH (ref 0.4–3.1)

## 2023-03-24 LAB — IRON AND TIBC
Iron: 17 ug/dL — ABNORMAL LOW (ref 45–182)
Saturation Ratios: 14 % — ABNORMAL LOW (ref 17.9–39.5)
TIBC: 120 ug/dL — ABNORMAL LOW (ref 250–450)
UIBC: 103 ug/dL

## 2023-03-24 LAB — OSMOLALITY: Osmolality: 281 mosm/kg (ref 275–295)

## 2023-03-24 LAB — MAGNESIUM: Magnesium: 2.1 mg/dL (ref 1.7–2.4)

## 2023-03-24 MED ORDER — FOLIC ACID 1 MG PO TABS
1.0000 mg | ORAL_TABLET | Freq: Every day | ORAL | Status: DC
Start: 2023-03-25 — End: 2023-03-25
  Administered 2023-03-25: 1 mg via ORAL
  Filled 2023-03-24: qty 1

## 2023-03-24 MED ORDER — VITAMIN B-12 1000 MCG PO TABS
1000.0000 ug | ORAL_TABLET | Freq: Every day | ORAL | Status: DC
  Administered 2023-03-25: 1000 ug via ORAL
  Filled 2023-03-24: qty 1

## 2023-03-24 MED ORDER — POTASSIUM CHLORIDE CRYS ER 20 MEQ PO TBCR
40.0000 meq | EXTENDED_RELEASE_TABLET | ORAL | Status: AC
  Administered 2023-03-24 (×2): 40 meq via ORAL
  Filled 2023-03-24 (×2): qty 2

## 2023-03-24 MED ORDER — CYANOCOBALAMIN 1000 MCG/ML IJ SOLN
1000.0000 ug | Freq: Once | INTRAMUSCULAR | Status: AC
  Administered 2023-03-24: 1000 ug via SUBCUTANEOUS
  Filled 2023-03-24: qty 1

## 2023-03-24 MED ORDER — FERROUS SULFATE 325 (65 FE) MG PO TABS
325.0000 mg | ORAL_TABLET | Freq: Every day | ORAL | Status: DC
  Administered 2023-03-24 – 2023-03-25 (×2): 325 mg via ORAL
  Filled 2023-03-24 (×2): qty 1

## 2023-03-24 MED ORDER — SODIUM CHLORIDE 1 G PO TABS
2.0000 g | ORAL_TABLET | Freq: Three times a day (TID) | ORAL | Status: AC
  Administered 2023-03-24 (×2): 2 g via ORAL
  Filled 2023-03-24 (×2): qty 2

## 2023-03-24 MED ORDER — FOLIC ACID 5 MG/ML IJ SOLN
1.0000 mg | Freq: Once | INTRAMUSCULAR | Status: DC
Start: 2023-03-24 — End: 2023-03-24

## 2023-03-24 MED ORDER — FOLIC ACID 5 MG/ML IJ SOLN
1.0000 mg | Freq: Once | INTRAMUSCULAR | Status: AC
  Administered 2023-03-24: 1 mg via INTRAVENOUS
  Filled 2023-03-24: qty 0.2

## 2023-03-24 NOTE — Progress Notes (Signed)
[facility] Progress Note Patient: Jeff Anderson [id number]:[id number] DOB: 04-18-1965 DOA: 03/21/2023  DOS: the patient was seen and examined on 03/24/2023  Brief Hospital Course: PMH of etoh abuse. Presented with complaints of shortness of breath and abdominal pain. Found to have pneumonia. Currently being treated with antibiotics.  Assessment and Plan: Sepsis due to pneumonia, likely aspiration rather than healthcare associated. On IV antibiotics. Will switch to Unasyn.  Switch to oral tomorrow. Follow-up on cultures. Monitor and improvement in respiratory status. Met SIRS criteria on admission.  Gastroenteritis. Appears to be worsening on the CT scan although clinically improving. Will monitor for toleration of oral diet.  History of prolonged QTc. Will monitor.  Macrocytic anemia B12, folic acid and iron deficiency anemia. Baseline hemoglobin around 11. On admission hemoglobin was around 12. Dropped down to 8 and currently 9.1. No active bleeding.  Monitor. Replacing aggressively with B12 folic acid injectable as well as oral iron therapy.  Alcohol abuse  No evidence of withdrawal. Monitor.  Hyponatremia. Will add sodium tablets and monitor. Likely from poor p.o. intake.  Hypokalemia. Replacing.  Mild hypocalcemia. For now we will monitor. Corrected calcium for albumin is normal.   Subjective: No nausea no vomiting.  No fever no chills.  No diarrhea.  Physical Exam: General: in Mild distress, No Rash Cardiovascular: S1 and S2 Present, No Murmur Respiratory: Good respiratory effort, Bilateral Air entry present.  Basal crackles, No wheezes Abdomen: Bowel Sound present, No tenderness Extremities: No edema Neuro: Alert and oriented x3, no new focal deficit  Data Reviewed: I have Reviewed nursing notes, Vitals, and Lab results. Since last encounter, pertinent lab results CBC and BMP   . I have ordered test including CBC and BMP  .    Disposition: Status is: Inpatient Remains inpatient appropriate because: Monitor for improvement in oxygenation.   Family Communication: None at bedside Level of care: Progressive   Vitals:   03/23/23 1336 03/23/23 1920 03/24/23 0531 03/24/23 1219  BP: 119/83 126/75 113/73 124/79  Pulse: 87 91 82 87  Resp: 16 20 14 19   Temp: 98.4 F (36.9 C) 98.1 F (36.7 C) 98.5 F (36.9 C) 98.4 F (36.9 C)  TempSrc: Oral Oral Oral Oral  SpO2:  (!) 80% 98% 94%  Weight:      Height:         Author: Lynden Oxford, MD 03/24/2023 7:44 PM  Please look on [email or web address] to find out who is on call.

## 2023-03-24 NOTE — Plan of Care (Signed)
  Problem: Education: Goal: Knowledge of General Education information will improve Description: Including pain rating scale, medication(s)/side effects and non-pharmacologic comfort measures Outcome: Progressing   Problem: Clinical Measurements: Goal: Ability to maintain clinical measurements within normal limits will improve Outcome: Progressing   Problem: Pain Management: Goal: General experience of comfort will improve Outcome: Progressing   Problem: Education: Goal: Knowledge of General Education information will improve Description: Including pain rating scale, medication(s)/side effects and non-pharmacologic comfort measures Outcome: Progressing   Problem: Clinical Measurements: Goal: Ability to maintain clinical measurements within normal limits will improve Outcome: Progressing   Problem: Pain Management: Goal: General experience of comfort will improve Outcome: Progressing

## 2023-03-25 LAB — LEGIONELLA PNEUMOPHILA SEROGP 1 UR AG: L. pneumophila Serogp 1 Ur Ag: NEGATIVE

## 2023-03-25 LAB — BASIC METABOLIC PANEL
Anion gap: 5 (ref 5–15)
BUN: 16 mg/dL (ref 6–20)
CO2: 25 mmol/L (ref 22–32)
Calcium: 7.9 mg/dL — ABNORMAL LOW (ref 8.9–10.3)
Chloride: 104 mmol/L (ref 98–111)
Creatinine, Ser: 0.62 mg/dL (ref 0.61–1.24)
GFR, Estimated: >60 mL/min (ref >60)
Glucose, Bld: 116 mg/dL — ABNORMAL HIGH (ref 70–99)
Potassium: 4.4 mmol/L (ref 3.5–5.1)
Sodium: 134 mmol/L — ABNORMAL LOW (ref 135–145)

## 2023-03-25 LAB — CBC
HCT: 23.3 % — ABNORMAL LOW (ref 39.0–52.0)
Hemoglobin: 8.2 g/dL — ABNORMAL LOW (ref 13.0–17.0)
MCH: 42.3 pg — ABNORMAL HIGH (ref 26.0–34.0)
MCHC: 35.2 g/dL (ref 30.0–36.0)
MCV: 120.1 fL — ABNORMAL HIGH (ref 80.0–100.0)
Platelets: 428 10*3/uL — ABNORMAL HIGH (ref 150–400)
RBC: 1.94 MIL/uL — ABNORMAL LOW (ref 4.22–5.81)
RDW: 14.9 % (ref 11.5–15.5)
WBC: 10.2 10*3/uL (ref 4.0–10.5)
nRBC: 0 % (ref 0.0–0.2)

## 2023-03-25 LAB — CULTURE, RESPIRATORY W GRAM STAIN: Culture: NORMAL

## 2023-03-25 LAB — MAGNESIUM: Magnesium: 2.1 mg/dL (ref 1.7–2.4)

## 2023-03-25 MED ORDER — THIAMINE HCL 100 MG PO TABS: 100.0000 mg | ORAL_TABLET | Freq: Every day | ORAL | 0 refills | Status: DC

## 2023-03-25 MED ORDER — AMOXICILLIN-POT CLAVULANATE 875-125 MG PO TABS: 1.0000 | ORAL_TABLET | Freq: Two times a day (BID) | ORAL | 0 refills | Status: AC

## 2023-03-25 MED ORDER — SUCRALFATE 1 G PO TABS: 1.0000 g | ORAL_TABLET | Freq: Two times a day (BID) | ORAL | 0 refills | Status: DC

## 2023-03-25 MED ORDER — CYANOCOBALAMIN 1000 MCG PO TABS: 1000.0000 ug | ORAL_TABLET | Freq: Every day | ORAL | 0 refills | Status: AC

## 2023-03-25 MED ORDER — NICOTINE 21 MG/24HR TD PT24: 21.0000 mg | MEDICATED_PATCH | Freq: Every day | TRANSDERMAL | 0 refills | Status: AC

## 2023-03-25 MED ORDER — PANTOPRAZOLE SODIUM 40 MG PO TBEC: 40.0000 mg | DELAYED_RELEASE_TABLET | Freq: Two times a day (BID) | ORAL | 0 refills | Status: DC

## 2023-03-25 MED ORDER — FERROUS SULFATE 325 (65 FE) MG PO TABS: 325.0000 mg | ORAL_TABLET | Freq: Every day | ORAL | 0 refills | Status: AC

## 2023-03-27 LAB — CULTURE, BLOOD (ROUTINE X 2)
Culture: NO GROWTH
Culture: NO GROWTH
Special Requests: ADEQUATE

## 2023-03-27 NOTE — Discharge Summary (Signed)
Physician Discharge Summary   Patient: Jeff Anderson MRN: [id number] DOB: 07/21/1965  Admit date:     03/21/2023  Discharge date: 03/25/2023  Discharge Physician: Lynden Oxford  PCP: Richardean Chimera, MD  Recommendations at discharge: Follow-up with PCP with a BMP and a CBC.   Follow-up Information     Richardean Chimera, MD. Schedule an appointment as soon as possible for a visit in 1 week(s).   Specialty: Family Medicine Why: with BMP lab to look at kidney and electrolytes, with CBC lab to look at blood counts Contact information: [address] [place] [address] [phone]                Discharge Diagnoses: Principal Problem:   HCAP (healthcare-associated pneumonia) Active Problems:   Nausea & vomiting   Hypokalemia  Hospital Course: PMH of etoh abuse. Presented with complaints of shortness of breath and abdominal pain. Found to have pneumonia. Currently being treated with antibiotics.  Assessment and Plan: Sepsis due to pneumonia, likely aspiration rather than healthcare associated. On IV antibiotics. Will switch to Unasyn.  Switch to oral t.  Blood cultures negative. Respiratory status significantly improved. Met SIRS criteria on admission.  Gastroenteritis. Appears to be worsening on the CT scan although clinically improving. Tolerating oral diet.  History of prolonged QTc. Will monitor.  Macrocytic anemia B12, folic acid and iron deficiency anemia. Baseline hemoglobin around 11. On admission hemoglobin was around 12. Dropped down to 8 and currently 9.1. No active bleeding.  Monitor. Replacing aggressively with B12 folic acid injectable as well as oral iron therapy.  Alcohol abuse  No evidence of withdrawal. Monitor.  Hyponatremia. Will add sodium tablets and monitor. Likely from poor p.o. intake.  Hypokalemia. Replacing.  Mild hypocalcemia. For now we will monitor. Corrected calcium for albumin is normal.  Consultants:   none  Procedures performed:  noned  DISCHARGE MEDICATION: Allergies as of 03/25/2023       Reactions   Codeine Nausea And Vomiting        Medication List     STOP taking these medications    oxyCODONE-acetaminophen 5-325 MG tablet Commonly known as: PERCOCET/ROXICET       TAKE these medications    amoxicillin-clavulanate 875-125 MG tablet Commonly known as: AUGMENTIN Take 1 tablet by mouth 2 (two) times daily for 2 days.   cyanocobalamin 1000 MCG tablet Take 1 tablet (1,000 mcg total) by mouth daily.   docusate sodium 100 MG capsule Commonly known as: Colace Take 1 capsule (100 mg total) by mouth 2 (two) times daily.   ferrous sulfate 325 (65 FE) MG tablet Take 1 tablet (325 mg total) by mouth daily with breakfast.   folic acid 1 MG tablet Commonly known as: FOLVITE Take 1 tablet (1 mg total) by mouth daily.   nicotine 21 mg/24hr patch Commonly known as: NICODERM CQ - dosed in mg/24 hours Place 1 patch (21 mg total) onto the skin daily.   pantoprazole 40 MG tablet Commonly known as: Protonix Take 1 tablet (40 mg total) by mouth 2 (two) times daily before a meal. What changed: when to take this   polyethylene glycol 17 g packet Commonly known as: MiraLax Take 17 g by mouth daily as needed. What changed: reasons to take this   prochlorperazine 10 MG tablet Commonly known as: COMPAZINE Take 1 tablet (10 mg total) by mouth every 6 (six) hours as needed for nausea or vomiting.   sucralfate 1 g tablet Commonly known as:  Carafate Take 1 tablet (1 g total) by mouth 2 (two) times daily for 15 days.   thiamine 100 MG tablet Commonly known as: VITAMIN B1 Take 1 tablet (100 mg total) by mouth daily.       Disposition: Home Diet recommendation: Cardiac diet  Discharge Exam: Vitals:   03/24/23 0531 03/24/23 1219 03/24/23 1900 03/25/23 0412  BP: 113/73 124/79 112/75 118/73  Pulse: 82 87 87 74  Resp: 14 19  14   Temp: 98.5 F (36.9 C) 98.4 F (36.9  C) 98.7 F (37.1 C) 98.1 F (36.7 C)  TempSrc: Oral Oral Oral Oral  SpO2: 98% 94% 96% 100%  Weight:      Height:       General: Appear in no distress; no visible Abnormal Neck Mass Or lumps, Conjunctiva normal Cardiovascular: S1 and S2 Present, no Murmur, Respiratory: good respiratory effort, Bilateral Air entry present and CTA, no Crackles, no wheezes Abdomen: Bowel Sound present, Non tender  Extremities: no Pedal edema Neurology: alert and oriented to time, place, and person  Filed Weights   03/22/23 0029  Weight: 65.8 kg   Condition at discharge: stable  The results of significant diagnostics from this hospitalization (including imaging, microbiology, ancillary and laboratory) are listed below for reference.   Imaging Studies: CT CHEST ABDOMEN PELVIS W CONTRAST Result Date: 03/22/2023 CLINICAL DATA:  Sepsis EXAM: CT CHEST, ABDOMEN, AND PELVIS WITH CONTRAST TECHNIQUE: Multidetector CT imaging of the chest, abdomen and pelvis was performed following the standard protocol during bolus administration of intravenous contrast. RADIATION DOSE REDUCTION: This exam was performed according to the departmental dose-optimization program which includes automated exposure control, adjustment of the mA and/or kV according to patient size and/or use of iterative reconstruction technique. CONTRAST:  OMNIPAQUE IOHEXOL 300 MG/ML  SOLN COMPARISON:  03/18/2023 FINDINGS: CT CHEST FINDINGS Cardiovascular: No significant vascular findings. Normal heart size. No pericardial effusion. Mediastinum/Nodes: No lymphadenopathy. Wall thickening of the distal thoracic esophagus. The esophagus is filled with fluid. Lungs/Pleura: Biapical paraseptal emphysema. Small right pleural effusion with basilar atelectasis. Multifocal old tree in bud type opacities in the left lung. Musculoskeletal: Fractures of the right fourth through seventh ribs. CT ABDOMEN PELVIS FINDINGS Hepatobiliary: Small volume perihepatic ascites.  No focal liver lesion. Normal gallbladder. Pancreas: Normal Spleen: Small amount of perisplenic ascites. No focal parenchymal lesion. Adrenals/Urinary Tract: Normal adrenal glands. No hydroureteronephrosis, nephroureterolithiasis or solid renal mass. Stomach/Bowel: There is mild hyperenhancement of the gastric lumen. There is inflammatory change along the transverse colon, greatest near the hepatic flexure. Small volume ascites within the abdomen and extending into the pelvis. Vascular/Lymphatic: Calcific aortic atherosclerosis. No lymphadenopathy. Reproductive: Prostate is unremarkable. Other: No abdominal wall hernia or abnormality. Musculoskeletal: No fracture is seen. IMPRESSION: 1. Multifocal inflammatory change and free fluid interspersed between loops of colon and small bowel throughout the abdomen, worsened from 12/15 and compatible with gastroenteritis. 2. Fluid-filled esophagus with mucosal hyperenhancement, in addition to gastric mucosal hyperenhancement. This may indicate a combination of gastritis and esophagitis. 3. Small right pleural effusion with basilar atelectasis. 4. Fractures of the right fourth through seventh ribs. 5. Multifocal tree in bud type opacities in the left lung, consistent with bronchiolitis. Aortic Atherosclerosis (ICD10-I70.0) and Emphysema (ICD10-J43.9). Electronically Signed   By: Deatra Robinson M.D.   On: 03/22/2023 02:54   DG Chest Port 1 View Result Date: 03/22/2023 CLINICAL DATA:  Sepsis EXAM: PORTABLE CHEST 1 VIEW COMPARISON:  03/11/2023 FINDINGS: There is developing focal pulmonary opacity within the medial right lung base,  in keeping with developing atelectasis or infiltrate. The lungs remain mildly hyperinflated suggesting changes of underlying COPD. No pneumothorax. Small right pleural effusion has developed. Acute fractures of the right 3-7 ribs are again seen laterally. IMPRESSION: 1. Developing focal pulmonary opacity within the medial right lung base, in keeping  with developing atelectasis or infiltrate. 2. Interval development of small right pleural effusion. 3. Acute fractures of the right 3-7 ribs laterally. Electronically Signed   By: Helyn Numbers M.D.   On: 03/22/2023 00:17   CT ABDOMEN PELVIS W CONTRAST Result Date: 03/18/2023 CLINICAL DATA:  Nausea and vomiting.  Recent rib fractures EXAM: CT ABDOMEN AND PELVIS WITH CONTRAST TECHNIQUE: Multidetector CT imaging of the abdomen and pelvis was performed using the standard protocol following bolus administration of intravenous contrast. RADIATION DOSE REDUCTION: This exam was performed according to the departmental dose-optimization program which includes automated exposure control, adjustment of the mA and/or kV according to patient size and/or use of iterative reconstruction technique. CONTRAST:  OMNIPAQUE IOHEXOL 300 MG/ML  SOLN COMPARISON:  None Available. FINDINGS: Lower chest: Trace right mild right basilar subsegmental atelectasis. Heart size is normal. Hepatobiliary: No focal liver abnormality is seen. No gallstones, gallbladder wall thickening, or biliary dilatation. Pancreas: Unremarkable. No pancreatic ductal dilatation or surrounding inflammatory changes. Spleen: Normal in size without focal abnormality. Adrenals/Urinary Tract: Unremarkable adrenal glands. Kidneys enhance symmetrically without focal lesion, stone, or hydronephrosis. Ureters are nondilated. Urinary bladder appears unremarkable for the degree of distention. Stomach/Bowel: Wall thickening of the gastric antrum and pylorus. There is also mild wall thickening involving several loops of small bowel within the abdomen. No evidence of bowel obstruction. Normal appendix in the right lower quadrant. No focal colonic wall thickening or inflammatory changes. Vascular/Lymphatic: Scattered aortoiliac atherosclerotic calcifications without aneurysm. No abdominopelvic lymphadenopathy. Reproductive: Prostate is unremarkable. Other: No free fluid.  No abdominopelvic fluid collection. No pneumoperitoneum. No abdominal wall hernia. Musculoskeletal: No acute or significant osseous findings. IMPRESSION: 1. Findings most compatible with gastroenteritis. No evidence of bowel obstruction. 2. Aortic atherosclerosis (ICD10-I70.0). Electronically Signed   By: Duanne Guess D.O.   On: 03/18/2023 20:02   DG Ribs Unilateral W/Chest Right Result Date: 03/11/2023 CLINICAL DATA:  Fall, right rib pain EXAM: RIGHT RIBS AND CHEST - 3+ VIEW COMPARISON:  None Available. FINDINGS: Fractures through the right lateral 4th through 6th ribs. No pneumothorax or effusion. Mild hyperinflation of the lungs. No confluent opacities. Heart is normal size. IMPRESSION: Right lateral 4th through 6th rib fractures.  No pneumothorax. Electronically Signed   By: Charlett Nose M.D.   On: 03/11/2023 00:37   DG Shoulder Right Result Date: 03/11/2023 CLINICAL DATA:  Recent fall with right shoulder pain, initial encounter EXAM: RIGHT SHOULDER - 2+ VIEW COMPARISON:  None Available. FINDINGS: No acute fracture or dislocation is noted in the shoulder joint. The clavicle appears within normal limits. Multiple rib fractures are noted posterolaterally on the right to include the fourth, fifth and sixth ribs. IMPRESSION: No acute shoulder abnormality noted. Posterolateral right rib fractures. Electronically Signed   By: Alcide Clever M.D.   On: 03/11/2023 00:37    Microbiology: Results for orders placed or performed during the hospital encounter of 03/21/23  Blood Culture (routine x 2)     Status: None (Preliminary result)   Collection Time: 03/21/23 11:35 PM   Specimen: BLOOD RIGHT ARM  Result Value Ref Range Status   Specimen Description   Final    BLOOD RIGHT ARM Performed at [facility],  [address], [place], [place] [address]    Special Requests   Final    BOTTLES DRAWN AEROBIC AND ANAEROBIC Blood Culture results may not be optimal due to an inadequate volume of blood  received in culture bottles Performed at [facility], [address], [place], [place] [address]    Culture   Final    NO GROWTH 4 DAYS Performed at [facility], [address], [place], [place] [address]    Report Status PENDING  Incomplete  Resp panel by RT-PCR (RSV, Flu A&B, Covid) Anterior Nasal Swab     Status: None   Collection Time: 03/21/23 11:36 PM   Specimen: Anterior Nasal Swab  Result Value Ref Range Status   SARS Coronavirus 2 by RT PCR NEGATIVE NEGATIVE Final    Comment: (NOTE) SARS-CoV-2 target nucleic acids are NOT DETECTED.  The SARS-CoV-2 RNA is generally detectable in upper respiratory specimens during the acute phase of infection. The lowest concentration of SARS-CoV-2 viral copies this assay can detect is 138 copies/mL. A negative result does not preclude SARS-Cov-2 infection and should not be used as the sole basis for treatment or other patient management decisions. A negative result may occur with  improper specimen collection/handling, submission of specimen other than nasopharyngeal swab, presence of viral mutation(s) within the areas targeted by this assay, and inadequate number of viral copies(<138 copies/mL). A negative result must be combined with clinical observations, patient history, and epidemiological information. The expected result is Negative.  Fact Sheet for Patients:  BloggerCourse.com  Fact Sheet for Healthcare Providers:  SeriousBroker.it  This test is no t yet approved or cleared by the [place] FDA and  has been authorized for detection and/or diagnosis of SARS-CoV-2 by FDA under an Emergency Use Authorization (EUA). This EUA will remain  in effect (meaning this test can be used) for the duration of the COVID-19 declaration under Section 564(b)(1) of the Act, 21 U.S.C.section 360bbb-3(b)(1), unless the authorization is terminated  or revoked sooner.        Influenza A by PCR NEGATIVE NEGATIVE Final   Influenza B by PCR NEGATIVE NEGATIVE Final    Comment: (NOTE) The Xpert Xpress SARS-CoV-2/FLU/RSV plus assay is intended as an aid in the diagnosis of influenza from Nasopharyngeal swab specimens and should not be used as a sole basis for treatment. Nasal washings and aspirates are unacceptable for Xpert Xpress SARS-CoV-2/FLU/RSV testing.  Fact Sheet for Patients: BloggerCourse.com  Fact Sheet for Healthcare Providers: SeriousBroker.it  This test is not yet approved or cleared by the [place] FDA and has been authorized for detection and/or diagnosis of SARS-CoV-2 by FDA under an Emergency Use Authorization (EUA). This EUA will remain in effect (meaning this test can be used) for the duration of the COVID-19 declaration under Section 564(b)(1) of the Act, 21 U.S.C. section 360bbb-3(b)(1), unless the authorization is terminated or revoked.     Resp Syncytial Virus by PCR NEGATIVE NEGATIVE Final    Comment: (NOTE) Fact Sheet for Patients: BloggerCourse.com  Fact Sheet for Healthcare Providers: SeriousBroker.it  This test is not yet approved or cleared by the [place] FDA and has been authorized for detection and/or diagnosis of SARS-CoV-2 by FDA under an Emergency Use Authorization (EUA). This EUA will remain in effect (meaning this test can be used) for the duration of the COVID-19 declaration under Section 564(b)(1) of the Act, 21 U.S.C. section 360bbb-3(b)(1), unless the authorization is terminated or revoked.  Performed at [facility], [address], [place], [place] [address]   Blood Culture (routine x 2)     Status: None (Preliminary result)   Collection Time: 03/22/23 12:28 AM   Specimen: BLOOD  Result Value Ref Range Status   Specimen Description   Final    BLOOD LEFT  ANTECUBITAL Performed at [facility], [address], [place], [place] [address]    Special Requests   Final    BOTTLES DRAWN AEROBIC AND ANAEROBIC Blood Culture adequate volume Performed at [facility], [address], [place], [place] [address]    Culture   Final    NO GROWTH 4 DAYS Performed at [facility], [address], [place], [place] [address]    Report Status PENDING  Incomplete  Expectorated Sputum Assessment w Gram Stain, Rflx to Resp Cult     Status: None   Collection Time: 03/22/23  6:39 AM   Specimen: Sputum  Result Value Ref Range Status   Specimen Description SPUTUM  Final   Special Requests SPUTUM  Final   Sputum evaluation   Final    THIS SPECIMEN IS ACCEPTABLE FOR SPUTUM CULTURE Performed at [facility], [address] [place], [place] [address]    Report Status 03/22/2023 FINAL  Final  Culture, Respiratory w Gram Stain     Status: None   Collection Time: 03/22/23  6:39 AM   Specimen: SPU  Result Value Ref Range Status   Specimen Description   Final    SPUTUM Performed at [facility], [address], [place], [place] [address]    Special Requests   Final    SPUTUM Reflexed from [id number] Performed at [facility], [address], [place], [place] [address]    Gram Stain   Final    RARE WBC PRESENT, PREDOMINANTLY PMN FEW GRAM POSITIVE COCCI IN PAIRS IN CLUSTERS FEW GRAM NEGATIVE RODS    Culture   Final    Normal respiratory flora-no Staph aureus or Pseudomonas seen Performed at [facility], [address] [place], [place] [address]    Report Status 03/25/2023 FINAL  Final   Labs: CBC: Recent Labs  Lab 03/21/23 2335 03/22/23 0635 03/23/23 0357 03/24/23 0443 03/24/23 1130 03/25/23 0449  WBC 29.5* 22.2* 17.0* 15.0* 13.8* 10.2  NEUTROABS 27.6*  --   --   --  12.4*  --   HGB 14.4 12.6* 10.1* 8.7* 9.1* 8.2*  HCT 39.2 34.4* 29.1* 24.7*  26.6* 23.3*  MCV 118.1* 118.2* 122.3* 119.9* 120.4* 120.1*  PLT 499* 350 340 398 429* 428*   Basic Metabolic Panel: Recent Labs  Lab 03/21/23 2335 03/22/23 0635 03/23/23 0357 03/24/23 0443 03/24/23 1130 03/25/23 0449  NA 132*  --  133* 129* 128* 134*  K 3.5  --  3.8 3.5 3.0* 4.4  CL 93*  --  102 100 97* 104  CO2 24  --  23 24 23 25   GLUCOSE 207*  --  91 103* 102* 116*  BUN 17  --  23* 18 16 16   CREATININE 1.07 1.01 0.68 0.71 0.51* 0.62  CALCIUM 8.7*  --  7.9* 7.6* 7.6* 7.9*  MG  --   --   --  2.1  --  2.1   Liver Function Tests: Recent Labs  Lab 03/21/23 2335 03/23/23 0357  AST 28 17  ALT 19 15  ALKPHOS 85 51  BILITOT 2.6* 2.0*  PROT 6.9 5.4*  ALBUMIN  3.3* 2.2*   CBG: No results for input(s): "GLUCAP" in the last 168 hours.  Discharge time spent: greater than 30 minutes.  Author: Lynden Oxford, MD  [facility] 03/25/2023

## 2023-05-09 ENCOUNTER — Other Ambulatory Visit: Payer: Self-pay

## 2023-05-09 ENCOUNTER — Emergency Department (HOSPITAL_COMMUNITY): Payer: PRIVATE HEALTH INSURANCE

## 2023-05-09 ENCOUNTER — Encounter (HOSPITAL_COMMUNITY): Payer: Self-pay

## 2023-05-09 ENCOUNTER — Inpatient Hospital Stay (HOSPITAL_COMMUNITY)
Admission: EM | Admit: 2023-05-09 | Discharge: 2023-05-12 | DRG: 381 | Disposition: A | Discharge status: Home / Self Care | Payer: PRIVATE HEALTH INSURANCE | Attending: Internal Medicine | Admitting: Internal Medicine

## 2023-05-09 DIAGNOSIS — E441 Mild protein-calorie malnutrition: Secondary | ICD-10-CM | POA: Diagnosis present

## 2023-05-09 DIAGNOSIS — E871 Hypo-osmolality and hyponatremia: Secondary | ICD-10-CM | POA: Diagnosis not present

## 2023-05-09 DIAGNOSIS — F1010 Alcohol abuse, uncomplicated: Secondary | ICD-10-CM | POA: Insufficient documentation

## 2023-05-09 DIAGNOSIS — D509 Iron deficiency anemia, unspecified: Secondary | ICD-10-CM | POA: Diagnosis present

## 2023-05-09 DIAGNOSIS — E46 Unspecified protein-calorie malnutrition: Secondary | ICD-10-CM | POA: Insufficient documentation

## 2023-05-09 DIAGNOSIS — F17210 Nicotine dependence, cigarettes, uncomplicated: Secondary | ICD-10-CM | POA: Diagnosis present

## 2023-05-09 DIAGNOSIS — K219 Gastro-esophageal reflux disease without esophagitis: Secondary | ICD-10-CM | POA: Insufficient documentation

## 2023-05-09 DIAGNOSIS — E538 Deficiency of other specified B group vitamins: Secondary | ICD-10-CM | POA: Diagnosis present

## 2023-05-09 DIAGNOSIS — R627 Adult failure to thrive: Secondary | ICD-10-CM | POA: Diagnosis present

## 2023-05-09 DIAGNOSIS — Z885 Allergy status to narcotic agent status: Secondary | ICD-10-CM

## 2023-05-09 DIAGNOSIS — D539 Nutritional anemia, unspecified: Secondary | ICD-10-CM | POA: Diagnosis present

## 2023-05-09 DIAGNOSIS — E8809 Other disorders of plasma-protein metabolism, not elsewhere classified: Secondary | ICD-10-CM | POA: Diagnosis present

## 2023-05-09 DIAGNOSIS — K2100 Gastro-esophageal reflux disease with esophagitis, without bleeding: Secondary | ICD-10-CM | POA: Diagnosis present

## 2023-05-09 DIAGNOSIS — Z79899 Other long term (current) drug therapy: Secondary | ICD-10-CM | POA: Diagnosis not present

## 2023-05-09 DIAGNOSIS — E876 Hypokalemia: Secondary | ICD-10-CM | POA: Diagnosis present

## 2023-05-09 DIAGNOSIS — D649 Anemia, unspecified: Secondary | ICD-10-CM | POA: Diagnosis present

## 2023-05-09 DIAGNOSIS — F1011 Alcohol abuse, in remission: Secondary | ICD-10-CM

## 2023-05-09 DIAGNOSIS — K255 Chronic or unspecified gastric ulcer with perforation: Secondary | ICD-10-CM | POA: Diagnosis present

## 2023-05-09 DIAGNOSIS — K259 Gastric ulcer, unspecified as acute or chronic, without hemorrhage or perforation: Secondary | ICD-10-CM | POA: Diagnosis present

## 2023-05-09 DIAGNOSIS — K529 Noninfective gastroenteritis and colitis, unspecified: Secondary | ICD-10-CM | POA: Diagnosis present

## 2023-05-09 DIAGNOSIS — Z681 Body mass index (BMI) 19 or less, adult: Secondary | ICD-10-CM

## 2023-05-09 DIAGNOSIS — D75838 Other thrombocytosis: Secondary | ICD-10-CM | POA: Diagnosis present

## 2023-05-09 DIAGNOSIS — K253 Acute gastric ulcer without hemorrhage or perforation: Secondary | ICD-10-CM | POA: Diagnosis present

## 2023-05-09 DIAGNOSIS — R188 Other ascites: Secondary | ICD-10-CM | POA: Diagnosis present

## 2023-05-09 DIAGNOSIS — D75839 Thrombocytosis, unspecified: Secondary | ICD-10-CM | POA: Insufficient documentation

## 2023-05-09 DIAGNOSIS — Z1152 Encounter for screening for COVID-19: Secondary | ICD-10-CM

## 2023-05-09 DIAGNOSIS — R636 Underweight: Secondary | ICD-10-CM

## 2023-05-09 HISTORY — DX: Iron deficiency anemia, unspecified: D50.9

## 2023-05-09 LAB — CBC WITH DIFFERENTIAL/PLATELET
Abs Immature Granulocytes: 0.02 10*3/uL (ref 0.00–0.07)
Basophils Absolute: 0 10*3/uL (ref 0.0–0.1)
Basophils Relative: 0 %
Eosinophils Absolute: 0 10*3/uL (ref 0.0–0.5)
Eosinophils Relative: 0 %
HCT: 38.4 % — ABNORMAL LOW (ref 39.0–52.0)
Hemoglobin: 13 g/dL (ref 13.0–17.0)
Immature Granulocytes: 0 %
Lymphocytes Relative: 29 %
Lymphs Abs: 2.2 10*3/uL (ref 0.7–4.0)
MCH: 34.1 pg — ABNORMAL HIGH (ref 26.0–34.0)
MCHC: 33.9 g/dL (ref 30.0–36.0)
MCV: 100.8 fL — ABNORMAL HIGH (ref 80.0–100.0)
Monocytes Absolute: 0.6 10*3/uL (ref 0.1–1.0)
Monocytes Relative: 8 %
Neutro Abs: 4.8 10*3/uL (ref 1.7–7.7)
Neutrophils Relative %: 63 %
Platelets: 413 10*3/uL — ABNORMAL HIGH (ref 150–400)
RBC: 3.81 MIL/uL — ABNORMAL LOW (ref 4.22–5.81)
RDW: 16.6 % — ABNORMAL HIGH (ref 11.5–15.5)
WBC: 7.6 10*3/uL (ref 4.0–10.5)
nRBC: 0 % (ref 0.0–0.2)

## 2023-05-09 LAB — COMPREHENSIVE METABOLIC PANEL
ALT: 9 U/L (ref 0–44)
AST: 13 U/L — ABNORMAL LOW (ref 15–41)
Albumin: 3.2 g/dL — ABNORMAL LOW (ref 3.5–5.0)
Alkaline Phosphatase: 82 U/L (ref 38–126)
Anion gap: 16 — ABNORMAL HIGH (ref 5–15)
BUN: 16 mg/dL (ref 6–20)
CO2: 30 mmol/L (ref 22–32)
Calcium: 9 mg/dL (ref 8.9–10.3)
Chloride: 91 mmol/L — ABNORMAL LOW (ref 98–111)
Creatinine, Ser: 0.83 mg/dL (ref 0.61–1.24)
GFR, Estimated: >60 mL/min (ref >60)
Glucose, Bld: 110 mg/dL — ABNORMAL HIGH (ref 70–99)
Potassium: 2.7 mmol/L — CL (ref 3.5–5.1)
Sodium: 137 mmol/L (ref 135–145)
Total Bilirubin: 1.1 mg/dL (ref 0.0–1.2)
Total Protein: 7.5 g/dL (ref 6.5–8.1)

## 2023-05-09 LAB — URINALYSIS, ROUTINE W REFLEX MICROSCOPIC
Bacteria, UA: NONE SEEN
Glucose, UA: NEGATIVE mg/dL
Hgb urine dipstick: NEGATIVE
Ketones, ur: 5 mg/dL — AB
Leukocytes,Ua: NEGATIVE
Nitrite: NEGATIVE
Protein, ur: 100 mg/dL — AB
Specific Gravity, Urine: 1.024 (ref 1.005–1.030)
pH: 8 (ref 5.0–8.0)

## 2023-05-09 LAB — RESP PANEL BY RT-PCR (RSV, FLU A&B, COVID)  RVPGX2
Influenza A by PCR: NEGATIVE
Influenza B by PCR: NEGATIVE
Resp Syncytial Virus by PCR: NEGATIVE
SARS Coronavirus 2 by RT PCR: NEGATIVE

## 2023-05-09 LAB — LIPASE, BLOOD: Lipase: 35 U/L (ref 11–51)

## 2023-05-09 MED ORDER — VITAMIN B-12 1000 MCG PO TABS
1000.0000 ug | ORAL_TABLET | Freq: Every day | ORAL | Status: DC
  Administered 2023-05-10 – 2023-05-12 (×3): 1000 ug via ORAL
  Filled 2023-05-09 (×3): qty 1

## 2023-05-09 MED ORDER — ACETAMINOPHEN 325 MG PO TABS: 650.0000 mg | ORAL_TABLET | Freq: Four times a day (QID) | ORAL | Status: DC | PRN

## 2023-05-09 MED ORDER — PROCHLORPERAZINE EDISYLATE 10 MG/2ML IJ SOLN
10.0000 mg | Freq: Four times a day (QID) | INTRAMUSCULAR | Status: DC | PRN
  Administered 2023-05-11: 10 mg via INTRAVENOUS
  Filled 2023-05-09: qty 2

## 2023-05-09 MED ORDER — POTASSIUM CHLORIDE 10 MEQ/100ML IV SOLN
10.0000 meq | Freq: Once | INTRAVENOUS | Status: AC
  Administered 2023-05-09: 10 meq via INTRAVENOUS
  Filled 2023-05-09: qty 100

## 2023-05-09 MED ORDER — FOLIC ACID 1 MG PO TABS
1.0000 mg | ORAL_TABLET | Freq: Every day | ORAL | Status: DC
Start: 2023-05-10 — End: 2023-05-12
  Administered 2023-05-10 – 2023-05-12 (×3): 1 mg via ORAL
  Filled 2023-05-09 (×3): qty 1

## 2023-05-09 MED ORDER — ENSURE ENLIVE PO LIQD
237.0000 mL | Freq: Two times a day (BID) | ORAL | Status: DC
  Administered 2023-05-10 – 2023-05-11 (×2): 237 mL via ORAL
  Filled 2023-05-09 (×2): qty 237

## 2023-05-09 MED ORDER — IOHEXOL 300 MG/ML  SOLN
100.0000 mL | Freq: Once | INTRAMUSCULAR | Status: AC | PRN
  Administered 2023-05-09: 100 mL via INTRAVENOUS

## 2023-05-09 MED ORDER — ACETAMINOPHEN 650 MG RE SUPP: 650.0000 mg | Freq: Four times a day (QID) | RECTAL | Status: DC | PRN

## 2023-05-09 MED ORDER — PANTOPRAZOLE SODIUM 40 MG IV SOLR
40.0000 mg | Freq: Once | INTRAVENOUS | Status: AC
  Administered 2023-05-09: 40 mg via INTRAVENOUS
  Filled 2023-05-09: qty 10

## 2023-05-09 MED ORDER — SODIUM CHLORIDE 0.9 % IV BOLUS
1000.0000 mL | Freq: Once | INTRAVENOUS | Status: AC
  Administered 2023-05-09: 1000 mL via INTRAVENOUS

## 2023-05-09 MED ORDER — FERROUS SULFATE 325 (65 FE) MG PO TABS
325.0000 mg | ORAL_TABLET | Freq: Every day | ORAL | Status: DC
  Administered 2023-05-11 – 2023-05-12 (×2): 325 mg via ORAL
  Filled 2023-05-09 (×3): qty 1

## 2023-05-09 MED ORDER — POTASSIUM CHLORIDE CRYS ER 20 MEQ PO TBCR: 40.0000 meq | EXTENDED_RELEASE_TABLET | Freq: Once | ORAL | Status: DC

## 2023-05-09 MED ORDER — ONDANSETRON HCL 4 MG/2ML IJ SOLN
4.0000 mg | Freq: Once | INTRAMUSCULAR | Status: AC
  Administered 2023-05-09: 4 mg via INTRAVENOUS
  Filled 2023-05-09: qty 2

## 2023-05-09 NOTE — ED Triage Notes (Signed)
 Pt arrived via POV c/o emesis, weight loss, decreased appetite and dehydration.

## 2023-05-09 NOTE — ED Provider Notes (Signed)
 [place] EMERGENCY DEPARTMENT AT [facility] Provider Note   CSN: [id number] Arrival date & time: 05/09/23  1631     History  Chief Complaint  Patient presents with   Failure To Thrive    Dainel Arcidiacono is a 58 y.o. male.  Patient with vomiting and abdominal pain.  He is lost 14 pounds over the last 2 weeks  The history is provided by the patient and medical records. No language interpreter was used.  Abdominal Pain Pain location:  Generalized Pain quality: aching   Pain radiates to:  Does not radiate Pain severity:  Moderate Onset quality:  Sudden Timing:  Constant Progression:  Worsening Chronicity:  New Context: not alcohol use   Relieved by:  Nothing Worsened by:  Nothing Ineffective treatments:  None tried Associated symptoms: anorexia   Associated symptoms: no chest pain, no cough, no diarrhea, no fatigue and no hematuria        Home Medications Prior to Admission medications   Medication Sig Start Date End Date Taking? Authorizing Provider  ferrous sulfate  325 (65 FE) MG tablet Take 1 tablet (325 mg total) by mouth daily with breakfast. 03/26/23  Yes Patel, Pranav M, MD  mirtazapine (REMERON SOL-TAB) 30 MG disintegrating tablet Take 30 mg by mouth at bedtime.   Yes [provider]  pantoprazole  (PROTONIX ) 40 MG tablet Take 1 tablet (40 mg total) by mouth 2 (two) times daily before a meal. 03/25/23 03/24/24 Yes Tobie Yetta HERO, MD  cyanocobalamin  1000 MCG tablet Take 1 tablet (1,000 mcg total) by mouth daily. 03/26/23   Tobie Yetta HERO, MD  docusate sodium  (COLACE) 100 MG capsule Take 1 capsule (100 mg total) by mouth 2 (two) times daily. 03/20/23 03/19/24  Ricky Fines, MD  folic acid  (FOLVITE ) 1 MG tablet Take 1 tablet (1 mg total) by mouth daily. 03/20/23   Ricky Fines, MD  nicotine  (NICODERM CQ  - DOSED IN MG/24 HOURS) 21 mg/24hr patch Place 1 patch (21 mg total) onto the skin daily. 03/26/23   Tobie Yetta HERO, MD  polyethylene  glycol (MIRALAX ) 17 g packet Take 17 g by mouth daily as needed. Patient taking differently: Take 17 g by mouth daily as needed for mild constipation or moderate constipation. 03/20/23   Ricky Fines, MD  prochlorperazine  (COMPAZINE ) 10 MG tablet Take 1 tablet (10 mg total) by mouth every 6 (six) hours as needed for nausea or vomiting. 03/20/23   Ricky Fines, MD  sucralfate  (CARAFATE ) 1 g tablet Take 1 tablet (1 g total) by mouth 2 (two) times daily for 15 days. 03/25/23 04/09/23  Tobie Yetta HERO, MD  thiamine  (VITAMIN B1) 100 MG tablet Take 1 tablet (100 mg total) by mouth daily. 03/25/23   Tobie Yetta HERO, MD      Allergies    Codeine    Review of Systems   Review of Systems  Constitutional:  Negative for appetite change and fatigue.  HENT:  Negative for congestion, ear discharge and sinus pressure.   Eyes:  Negative for discharge.  Respiratory:  Negative for cough.   Cardiovascular:  Negative for chest pain.  Gastrointestinal:  Positive for abdominal pain and anorexia. Negative for diarrhea.  Genitourinary:  Negative for frequency and hematuria.  Musculoskeletal:  Negative for back pain.  Skin:  Negative for rash.  Neurological:  Negative for seizures and headaches.  Psychiatric/Behavioral:  Negative for hallucinations.     Physical Exam Updated Vital Signs BP (!) 160/98 (BP Location: Left Arm)  Pulse 80   Temp 98.5 F (36.9 C) (Oral)   Resp 16   Ht 5' 11 (1.803 m)   Wt 53.5 kg   SpO2 100%   BMI 16.46 kg/m  Physical Exam Vitals and nursing note reviewed.  Constitutional:      Appearance: He is well-developed.  HENT:     Head: Normocephalic.     Nose: Nose normal.  Eyes:     General: No scleral icterus.    Conjunctiva/sclera: Conjunctivae normal.  Neck:     Thyroid: No thyromegaly.  Cardiovascular:     Rate and Rhythm: Normal rate and regular rhythm.     Heart sounds: No murmur heard.    No friction rub. No gallop.  Pulmonary:     Breath sounds: No  stridor. No wheezing or rales.  Chest:     Chest wall: No tenderness.  Abdominal:     General: There is no distension.     Tenderness: There is no abdominal tenderness. There is no rebound.  Musculoskeletal:        General: Normal range of motion.     Cervical back: Neck supple.  Lymphadenopathy:     Cervical: No cervical adenopathy.  Skin:    Findings: No erythema or rash.  Neurological:     Mental Status: He is alert and oriented to person, place, and time.     Motor: No abnormal muscle tone.     Coordination: Coordination normal.  Psychiatric:        Behavior: Behavior normal.     ED Results / Procedures / Treatments   Labs (all labs ordered are listed, but only abnormal results are displayed) Labs Reviewed  COMPREHENSIVE METABOLIC PANEL - Abnormal; Notable for the following components:      Result Value   Potassium 2.7 (*)    Chloride 91 (*)    Glucose, Bld 110 (*)    Albumin 3.2 (*)    AST 13 (*)    Anion gap 16 (*)    All other components within normal limits  CBC WITH DIFFERENTIAL/PLATELET - Abnormal; Notable for the following components:   RBC 3.81 (*)    HCT 38.4 (*)    MCV 100.8 (*)    MCH 34.1 (*)    RDW 16.6 (*)    Platelets 413 (*)    All other components within normal limits  URINALYSIS, ROUTINE W REFLEX MICROSCOPIC - Abnormal; Notable for the following components:   Color, Urine AMBER (*)    APPearance HAZY (*)    Bilirubin Urine SMALL (*)    Ketones, ur 5 (*)    Protein, ur 100 (*)    All other components within normal limits  RESP PANEL BY RT-PCR (RSV, FLU A&B, COVID)  RVPGX2  LIPASE, BLOOD    EKG None  Radiology CT ABDOMEN PELVIS W CONTRAST Result Date: 05/09/2023 CLINICAL DATA:  Abdominal pain. Previous abdominal surgery. Vomiting, weight loss, decreased appetite, and dehydration. EXAM: CT ABDOMEN AND PELVIS WITH CONTRAST TECHNIQUE: Multidetector CT imaging of the abdomen and pelvis was performed using the standard protocol following bolus  administration of intravenous contrast. RADIATION DOSE REDUCTION: This exam was performed according to the departmental dose-optimization program which includes automated exposure control, adjustment of the mA and/or kV according to patient size and/or use of iterative reconstruction technique. CONTRAST:  OMNIPAQUE  IOHEXOL  300 MG/ML  SOLN COMPARISON:  03/22/2023 FINDINGS: Lower chest: Lung bases are clear. Lower esophageal wall is thickened and edematous. This likely represents reflux  disease or esophagitis. Hepatobiliary: No focal liver abnormality is seen. No gallstones, gallbladder wall thickening, or biliary dilatation. Pancreas: Unremarkable. No pancreatic ductal dilatation or surrounding inflammatory changes. Spleen: Normal in size without focal abnormality. Adrenals/Urinary Tract: No adrenal gland nodules. Kidneys are symmetrical. Nephrograms are homogeneous. No hydronephrosis or hydroureter. Bladder is decompressed. Stomach/Bowel: Stomach is not abnormally distended. There is evidence of edema in the pyloric and duodenal bulb region, likely inflammatory. There is a small gas and fluid collection measuring 2.1 cm diameter in the region of the gastrohepatic ligament along the liver edge. This appears to communicate with the stomach and probably represents an ulcer. This is new since previous study. Small bowel are mostly decompressed. Decompression limits evaluation but small bowel wall appears thickened. This could indicate edema or enteritis. Scattered stool throughout the colon without abnormal distention. Appendix is not identified. Vascular/Lymphatic: Aortic atherosclerosis. No enlarged abdominal or pelvic lymph nodes. Reproductive: Prostate is unremarkable. Other: Moderate diffuse abdominal and pelvic ascites, increasing since previous study. No free air is identified. Musculoskeletal: No acute or significant osseous findings. IMPRESSION: 1. Edema around the pylorus and duodenal bulb with a small  air-fluid collection along the gastrohepatic ligament likely representing a developing gastric ulcer. 2. Edema around the lower esophagus may represent reflux disease or esophagitis. 3. Mild wall thickening in the small bowel may indicate enteritis. 4. Moderate abdominal and pelvic ascites, increasing since prior study. 5. Aortic atherosclerosis. Electronically Signed   By: Elsie Gravely M.D.   On: 05/09/2023 20:46   DG Chest 2 View Result Date: 05/09/2023 CLINICAL DATA:  Weakness. EXAM: CHEST - 2 VIEW COMPARISON:  March 21, 2023. FINDINGS: The heart size and mediastinal contours are within normal limits. Both lungs are clear. Old right rib fractures are noted. IMPRESSION: No active cardiopulmonary disease. Electronically Signed   By: Lynwood Landy Raddle M.D.   On: 05/09/2023 18:21    Procedures Procedures    Medications Ordered in ED Medications  sodium chloride  0.9 % bolus 1,000 mL (has no administration in time range)  potassium chloride  10 mEq in 100 mL IVPB (has no administration in time range)  ondansetron  (ZOFRAN ) injection 4 mg (has no administration in time range)  iohexol  (OMNIPAQUE ) 300 MG/ML solution 100 mL (100 mLs Intravenous Contrast Given 05/09/23 2026)  pantoprazole  (PROTONIX ) injection 40 mg (40 mg Intravenous Given 05/09/23 2133)    ED Course/ Medical Decision Making/ A&P                                 Medical Decision Making Amount and/or Complexity of Data Reviewed Labs: ordered. Radiology: ordered.  Risk Prescription drug management. Decision regarding hospitalization.  This patient presents to the ED for concern of abdominal pain, this involves an extensive number of treatment options, and is a complaint that carries with it a high risk of complications and morbidity.  The differential diagnosis includes appendicitis, diverticulitis   Co morbidities that complicate the patient evaluation  None   Additional history obtained:  Additional history obtained  from patient External records from outside source obtained and reviewed including hospital record   Lab Tests:  I Ordered, and personally interpreted labs.  The pertinent results include: Testing 2.7   Imaging Studies ordered:  I ordered imaging studies including CT abdomen I independently visualized and interpreted imaging which showed possible gastric ulcer I agree with the radiologist interpretation   Cardiac Monitoring: / EKG:  The patient was  maintained on a cardiac monitor.  I personally viewed and interpreted the cardiac monitored which showed an underlying rhythm of: Normal sinus rhythm   Consultations Obtained:  I requested consultation with the GI and hospitalist,  and discussed lab and imaging findings as well as pertinent plan - they recommend: Admit to hospitalist with GI consult and possible endoscopy   Problem List / ED Course / Critical interventions / Medication management  Abdominal pain and gastric ulcer I ordered medication including Protonix  for ulcer Reevaluation of the patient after these medicines showed that the patient stayed the same I have reviewed the patients home medicines and have made adjustments as needed   Social Determinants of Health:  None   Test / Admission - Considered:  None  Patient with vomiting and most likely gastric ulcer.  He will be admitted to medicine with GI consult        Final Clinical Impression(s) / ED Diagnoses Final diagnoses:  Acute gastric ulcer without hemorrhage or perforation    Rx / DC Orders ED Discharge Orders     None         Suzette Pac, MD 05/14/23 1105

## 2023-05-09 NOTE — H&P (Signed)
 History and Physical    Patient: Jeff Anderson FMW:[id number] DOB: 1966/02/22 DOA: 05/09/2023 DOS: the patient was seen and examined on 05/09/2023 PCP: Toribio Jerel MATSU, MD  Patient coming from: Home  Chief Complaint:  Chief Complaint  Patient presents with   Failure To Thrive   HPI: Jeff Anderson is a 58 y.o. male with medical history significant of alcohol abuse who presents to the emergency department due to 2-week onset of vomiting and constant abdominal pain.  Pain was diffuse and was described as twisting sensation.  He states that he has not been able to keep anything down, vomiting was 2-3 episodes daily.  He endorsed about 14 pound weight loss since onset of symptoms.  He denies use of NSAIDs including Goody powder and BC powders.  Patient denies use of alcohol. Patient was admitted from 03/21/2023 to 03/25/2023 due to sepsis due to pneumonia, likely aspiration and was treated with IV Unasyn    ED Course:  In the emergency room, pulse was 104 bpm, but other vital signs were within normal range.  Workup in the ED showed WBC 7.6, hemoglobin 13.0, hematocrit 38.4, MCV 100.8, platelets 413.  BMP was normal except for potassium of 2.7, chloride 91, blood glucose 110, albumin 3.2.  Urinalysis was positive for proteinuria but unimpressive for UTI.  Influenza A, B, SARS coronavirus 2, RSV was negative.   CT abdomen pelvis with contrast showed edema around the pylorus and duodenal bulb with a small air-fluid collection along the gastrohepatic ligament likely representing a developing gastric ulcer. Edema around the lower esophagus may represent reflux disease or esophagitis. Mild wall thickening in the small bowel may indicate enteritis. Chest x-ray showed no active cardiopulmonary disease Patient was started on Zofran , IV Protonix  40 mg x 1 was given, potassium was replenished Gastroenterology (Dr. Shaaron) was consulted and recommended admitting patient and place him n.p.o. at midnight  with plan for possible EGD in the morning. Hospitalist was asked admit patient for further evaluation and management.   Review of Systems: Review of systems as noted in the HPI. All other systems reviewed and are negative.   Past Medical History:  Diagnosis Date   Iron deficiency anemia    Past Surgical History:  Procedure Laterality Date   ANKLE SURGERY Left    ELBOW SURGERY      Social History:  reports that he has been smoking cigarettes. He has a 45 pack-year smoking history. He has never used smokeless tobacco. He reports current alcohol use of about 3.0 standard drinks of alcohol per week. He reports that he does not currently use drugs.   Allergies  Allergen Reactions   Codeine Nausea And Vomiting    History reviewed. No pertinent family history.   Prior to Admission medications   Medication Sig Start Date End Date Taking? Authorizing Provider  cyanocobalamin  1000 MCG tablet Take 1 tablet (1,000 mcg total) by mouth daily. 03/26/23  Yes Tobie Yetta HERO, MD  ferrous sulfate  325 (65 FE) MG tablet Take 1 tablet (325 mg total) by mouth daily with breakfast. 03/26/23  Yes Tobie Yetta HERO, MD  folic acid  (FOLVITE ) 1 MG tablet Take 1 tablet (1 mg total) by mouth daily. 03/20/23  Yes Ricky Fines, MD  mirtazapine (REMERON) 30 MG tablet Take 60 mg by mouth at bedtime. 04/19/23  Yes [provider]  nicotine  (NICODERM CQ  - DOSED IN MG/24 HOURS) 21 mg/24hr patch Place 1 patch (21 mg total) onto the skin daily. 03/26/23  Yes Patel, Pranav  M, MD  pantoprazole  (PROTONIX ) 40 MG tablet Take 1 tablet (40 mg total) by mouth 2 (two) times daily before a meal. 03/25/23 03/24/24 Yes Tobie Yetta HERO, MD  sucralfate  (CARAFATE ) 1 g tablet Take 1 tablet (1 g total) by mouth 2 (two) times daily for 15 days. 03/25/23 05/09/23 Yes Patel, Pranav M, MD  traMADol (ULTRAM) 50 MG tablet Take 200 mg by mouth daily. 04/02/23  Yes [provider]    Physical Exam: BP (!) 132/93   Pulse 78    Temp 98.5 F (36.9 C) (Oral)   Resp 16   Ht 5' 11 (1.803 m)   Wt 53.5 kg   SpO2 98%   BMI 16.46 kg/m   General: 58 y.o. year-old male well developed well nourished in no acute distress.  Alert and oriented x3. HEENT: NCAT, EOMI Neck: Supple, trachea medial Cardiovascular: Regular rate and rhythm with no rubs or gallops.  No thyromegaly or JVD noted.  No lower extremity edema. 2/4 pulses in all 4 extremities. Respiratory: Clear to auscultation with no wheezes or rales. Good inspiratory effort. Abdomen: Soft, nontender nondistended with normal bowel sounds x4 quadrants. Muskuloskeletal: No cyanosis, clubbing or edema noted bilaterally Neuro: CN II-XII intact, strength 5/5 x 4, sensation, reflexes intact Skin: No ulcerative lesions noted or rashes Psychiatry: Judgement and insight appear normal. Mood is appropriate for condition and setting          Labs on Admission:  Basic Metabolic Panel: Recent Labs  Lab 05/09/23 1815  NA 137  K 2.7*  CL 91*  CO2 30  GLUCOSE 110*  BUN 16  CREATININE 0.83  CALCIUM 9.0   Liver Function Tests: Recent Labs  Lab 05/09/23 1815  AST 13*  ALT 9  ALKPHOS 82  BILITOT 1.1  PROT 7.5  ALBUMIN 3.2*   Recent Labs  Lab 05/09/23 1815  LIPASE 35   No results for input(s): AMMONIA in the last 168 hours. CBC: Recent Labs  Lab 05/09/23 1815  WBC 7.6  NEUTROABS 4.8  HGB 13.0  HCT 38.4*  MCV 100.8*  PLT 413*   Cardiac Enzymes: No results for input(s): CKTOTAL, CKMB, CKMBINDEX, TROPONINI in the last 168 hours.  BNP (last 3 results) No results for input(s): BNP in the last 8760 hours.  ProBNP (last 3 results) No results for input(s): PROBNP in the last 8760 hours.  CBG: No results for input(s): GLUCAP in the last 168 hours.  Radiological Exams on Admission: CT ABDOMEN PELVIS W CONTRAST Result Date: 05/09/2023 CLINICAL DATA:  Abdominal pain. Previous abdominal surgery. Vomiting, weight loss, decreased appetite,  and dehydration. EXAM: CT ABDOMEN AND PELVIS WITH CONTRAST TECHNIQUE: Multidetector CT imaging of the abdomen and pelvis was performed using the standard protocol following bolus administration of intravenous contrast. RADIATION DOSE REDUCTION: This exam was performed according to the departmental dose-optimization program which includes automated exposure control, adjustment of the mA and/or kV according to patient size and/or use of iterative reconstruction technique. CONTRAST:  OMNIPAQUE  IOHEXOL  300 MG/ML  SOLN COMPARISON:  03/22/2023 FINDINGS: Lower chest: Lung bases are clear. Lower esophageal wall is thickened and edematous. This likely represents reflux disease or esophagitis. Hepatobiliary: No focal liver abnormality is seen. No gallstones, gallbladder wall thickening, or biliary dilatation. Pancreas: Unremarkable. No pancreatic ductal dilatation or surrounding inflammatory changes. Spleen: Normal in size without focal abnormality. Adrenals/Urinary Tract: No adrenal gland nodules. Kidneys are symmetrical. Nephrograms are homogeneous. No hydronephrosis or hydroureter. Bladder is decompressed. Stomach/Bowel: Stomach is not abnormally distended.  There is evidence of edema in the pyloric and duodenal bulb region, likely inflammatory. There is a small gas and fluid collection measuring 2.1 cm diameter in the region of the gastrohepatic ligament along the liver edge. This appears to communicate with the stomach and probably represents an ulcer. This is new since previous study. Small bowel are mostly decompressed. Decompression limits evaluation but small bowel wall appears thickened. This could indicate edema or enteritis. Scattered stool throughout the colon without abnormal distention. Appendix is not identified. Vascular/Lymphatic: Aortic atherosclerosis. No enlarged abdominal or pelvic lymph nodes. Reproductive: Prostate is unremarkable. Other: Moderate diffuse abdominal and pelvic ascites, increasing  since previous study. No free air is identified. Musculoskeletal: No acute or significant osseous findings. IMPRESSION: 1. Edema around the pylorus and duodenal bulb with a small air-fluid collection along the gastrohepatic ligament likely representing a developing gastric ulcer. 2. Edema around the lower esophagus may represent reflux disease or esophagitis. 3. Mild wall thickening in the small bowel may indicate enteritis. 4. Moderate abdominal and pelvic ascites, increasing since prior study. 5. Aortic atherosclerosis. Electronically Signed   By: Elsie Gravely M.D.   On: 05/09/2023 20:46   DG Chest 2 View Result Date: 05/09/2023 CLINICAL DATA:  Weakness. EXAM: CHEST - 2 VIEW COMPARISON:  March 21, 2023. FINDINGS: The heart size and mediastinal contours are within normal limits. Both lungs are clear. Old right rib fractures are noted. IMPRESSION: No active cardiopulmonary disease. Electronically Signed   By: Lynwood Landy Raddle M.D.   On: 05/09/2023 18:21    EKG: I independently viewed the EKG done and my findings are as followed: EKG was not done in the ED  Assessment/Plan Present on Admission:  Gastric ulcer  Macrocytic anemia  Hypokalemia  Principal Problem:   Gastric ulcer Active Problems:   Hypokalemia   Macrocytic anemia   GERD (gastroesophageal reflux disease)   Thrombocytosis   Underweight (BMI < 18.5)   Failure to thrive in adult   Hypoalbuminemia due to protein-calorie malnutrition (HCC)   Alcohol abuse  Gastric ulcer GERD vs esophagitis Continue IV Protonix  40 mg twice daily Patient will be placed n.p.o. at midnight Gastroenterologist (Dr. Shaaron) was consulted and recommended admitting patient with plan to do EGD in the morning per EDP  Hypokalemia K+ is 2.7 K+ will be replenished Please monitor for AM K+ for further replenishmemnt  Macrocytic anemia MCV 100.8, this may be related to patient's history of alcohol abuse Folate and vitamin B12 levels will be  checked Continue cyanocobalamin , ferrous sulfate , folic acid   Thrombocytosis possibly reactive Platelets 413, continue monitor platelet levels  History of alcohol abuse No evidence of alcohol withdrawal at this time Continue to monitor and consider starting patient on CIWA protocol for any withdrawal symptoms  Underweight (BMI 16.46) Failure to thrive in adult Hypoalbuminemia possibly secondary to mild protein calorie malnutrition Protein supplement will be provided Dietitian will be consulted and we shall await further recommendations  DVT prophylaxis: SCDs  Code Status: Full code  Family Communication: None at bedside  Consults: Gastroenterology, dietitian  Severity of Illness: The appropriate patient status for this patient is INPATIENT. Inpatient status is judged to be reasonable and necessary in order to provide the required intensity of service to ensure the patient's safety. The patient's presenting symptoms, physical exam findings, and initial radiographic and laboratory data in the context of their chronic comorbidities is felt to place them at high risk for further clinical deterioration. Furthermore, it is not anticipated that the patient will  be medically stable for discharge from the hospital within 2 midnights of admission.   * I certify that at the point of admission it is my clinical judgment that the patient will require inpatient hospital care spanning beyond 2 midnights from the point of admission due to high intensity of service, high risk for further deterioration and high frequency of surveillance required.*  Author: Bodhi Moradi, DO 05/09/2023 11:22 PM  For on call review [email or web address].

## 2023-05-10 DIAGNOSIS — F1090 Alcohol use, unspecified, uncomplicated: Secondary | ICD-10-CM

## 2023-05-10 DIAGNOSIS — E538 Deficiency of other specified B group vitamins: Secondary | ICD-10-CM

## 2023-05-10 DIAGNOSIS — K251 Acute gastric ulcer with perforation: Secondary | ICD-10-CM

## 2023-05-10 DIAGNOSIS — K259 Gastric ulcer, unspecified as acute or chronic, without hemorrhage or perforation: Secondary | ICD-10-CM

## 2023-05-10 DIAGNOSIS — R188 Other ascites: Secondary | ICD-10-CM

## 2023-05-10 LAB — GLUCOSE, CAPILLARY
Glucose-Capillary: 72 mg/dL (ref 70–99)
Glucose-Capillary: 77 mg/dL (ref 70–99)
Glucose-Capillary: 126 mg/dL — ABNORMAL HIGH (ref 70–99)
Glucose-Capillary: 101 mg/dL — ABNORMAL HIGH (ref 70–99)

## 2023-05-10 LAB — COMPREHENSIVE METABOLIC PANEL
ALT: 9 U/L (ref 0–44)
AST: 10 U/L — ABNORMAL LOW (ref 15–41)
Albumin: 2.7 g/dL — ABNORMAL LOW (ref 3.5–5.0)
Alkaline Phosphatase: 70 U/L (ref 38–126)
Anion gap: 8 (ref 5–15)
BUN: 15 mg/dL (ref 6–20)
CO2: 31 mmol/L (ref 22–32)
Calcium: 8.3 mg/dL — ABNORMAL LOW (ref 8.9–10.3)
Chloride: 95 mmol/L — ABNORMAL LOW (ref 98–111)
Creatinine, Ser: 0.77 mg/dL (ref 0.61–1.24)
GFR, Estimated: >60 mL/min (ref >60)
Glucose, Bld: 87 mg/dL (ref 70–99)
Potassium: 3.1 mmol/L — ABNORMAL LOW (ref 3.5–5.1)
Sodium: 134 mmol/L — ABNORMAL LOW (ref 135–145)
Total Bilirubin: 0.9 mg/dL (ref 0.0–1.2)
Total Protein: 6.5 g/dL (ref 6.5–8.1)

## 2023-05-10 LAB — CBC
HCT: 34.8 % — ABNORMAL LOW (ref 39.0–52.0)
Hemoglobin: 11.7 g/dL — ABNORMAL LOW (ref 13.0–17.0)
MCH: 34.6 pg — ABNORMAL HIGH (ref 26.0–34.0)
MCHC: 33.6 g/dL (ref 30.0–36.0)
MCV: 103 fL — ABNORMAL HIGH (ref 80.0–100.0)
Platelets: 320 10*3/uL (ref 150–400)
RBC: 3.38 MIL/uL — ABNORMAL LOW (ref 4.22–5.81)
RDW: 16.6 % — ABNORMAL HIGH (ref 11.5–15.5)
WBC: 7 10*3/uL (ref 4.0–10.5)
nRBC: 0 % (ref 0.0–0.2)

## 2023-05-10 LAB — FOLATE: Folate: 4.2 ng/mL — ABNORMAL LOW (ref >5.9)

## 2023-05-10 LAB — VITAMIN B12: Vitamin B-12: 230 pg/mL (ref 180–914)

## 2023-05-10 LAB — PHOSPHORUS: Phosphorus: 4.2 mg/dL (ref 2.5–4.6)

## 2023-05-10 LAB — MAGNESIUM: Magnesium: 2 mg/dL (ref 1.7–2.4)

## 2023-05-10 MED ORDER — PANTOPRAZOLE SODIUM 40 MG IV SOLR
40.0000 mg | Freq: Two times a day (BID) | INTRAVENOUS | Status: DC
  Administered 2023-05-10 – 2023-05-12 (×4): 40 mg via INTRAVENOUS
  Filled 2023-05-10 (×4): qty 10

## 2023-05-10 MED ORDER — POTASSIUM CHLORIDE CRYS ER 20 MEQ PO TBCR
40.0000 meq | EXTENDED_RELEASE_TABLET | Freq: Once | ORAL | Status: AC
  Administered 2023-05-10: 40 meq via ORAL
  Filled 2023-05-10: qty 2

## 2023-05-10 MED ORDER — PANTOPRAZOLE SODIUM 40 MG IV SOLR
40.0000 mg | Freq: Every day | INTRAVENOUS | Status: DC
  Administered 2023-05-10: 40 mg via INTRAVENOUS
  Filled 2023-05-10: qty 10

## 2023-05-10 MED ORDER — SODIUM CHLORIDE 0.9 % IV SOLN: INTRAVENOUS | Status: AC

## 2023-05-10 NOTE — ED Notes (Signed)
Report given to admission RN.  All questions answered

## 2023-05-10 NOTE — Progress Notes (Signed)
 PROGRESS NOTE    Jeff Anderson  FMW:[id number] DOB: [birth year] DOA: 05/09/2023 PCP: Toribio Jerel MATSU, MD   Brief Narrative:    Jeff Anderson is a 58 y.o. male with medical history significant of alcohol abuse who presents to the emergency department due to 2-week onset of vomiting and constant abdominal pain.  Pain was diffuse and was described as twisting sensation.  He states that he has not been able to keep anything down, vomiting was 2-3 episodes daily.  He endorsed about 14 pound weight loss since onset of symptoms.  Patient was admitted due to need for GI evaluation.  Assessment & Plan:   Principal Problem:   Gastric ulcer Active Problems:   Hypokalemia   Macrocytic anemia   GERD (gastroesophageal reflux disease)   Thrombocytosis   Underweight (BMI < 18.5)   Failure to thrive in adult   Hypoalbuminemia due to protein-calorie malnutrition (HCC)   Alcohol abuse  Assessment and Plan:   Gastric ulcer GERD vs esophagitis Continue IV Protonix  40 mg twice daily Patient will be placed n.p.o. at midnight Gastroenterologist (Dr. Shaaron) was consulted and recommended admitting patient with plan to do EGD in the morning per EDP   Hypokalemia Continue to replete and monitor   Macrocytic anemia MCV 100.8, this may be related to patient's history of alcohol abuse Noted to have low levels of folate and supplementation will be ordered   Thrombocytosis possibly reactive Platelets 413, continue monitor platelet levels   History of alcohol abuse No evidence of alcohol withdrawal at this time Continue to monitor and consider starting patient on CIWA protocol for any withdrawal symptoms   Underweight (BMI 16.46) Failure to thrive in adult Hypoalbuminemia possibly secondary to mild protein calorie malnutrition Protein supplement will be provided Dietitian will be consulted and we shall await further recommendations   DVT prophylaxis: SCDs Code Status: Full Family  Communication: None at bedside Disposition Plan:  Status is: Inpatient Remains inpatient appropriate because: Need for IV medications and inpatient evaluation.  Consultants:  GI  Procedures:  None  Antimicrobials:  None   Subjective: Patient seen and evaluated today with no new acute complaints or concerns. No acute concerns or events noted overnight.  Objective: Vitals:   05/10/23 0200 05/10/23 0230 05/10/23 0235 05/10/23 0302  BP: 121/78  (!) 157/102 139/85  Pulse: 60 72 61 69  Resp: 16 16  20   Temp:  98.6 F (37 C)  97.9 F (36.6 C)  TempSrc:  Oral    SpO2: 95% 98% 99% 100%  Weight:      Height:        Intake/Output Summary (Last 24 hours) at 05/10/2023 0704 Last data filed at 05/09/2023 2316 Gross per 24 hour  Intake 1100 ml  Output --  Net 1100 ml   Filed Weights   05/09/23 1711  Weight: 53.5 kg    Examination:  General exam: Appears calm and comfortable  Respiratory system: Clear to auscultation. Respiratory effort normal. Cardiovascular system: S1 & S2 heard, RRR.  Gastrointestinal system: Abdomen is soft Central nervous system: Alert and awake Extremities: No edema Skin: No significant lesions noted Psychiatry: Flat affect.    Data Reviewed: I have personally reviewed following labs and imaging studies  CBC: Recent Labs  Lab 05/09/23 1815 05/10/23 0246  WBC 7.6 7.0  NEUTROABS 4.8  --   HGB 13.0 11.7*  HCT 38.4* 34.8*  MCV 100.8* 103.0*  PLT 413* 320   Basic Metabolic Panel: Recent Labs  Lab  05/09/23 1815 05/10/23 0246  NA 137 134*  K 2.7* 3.1*  CL 91* 95*  CO2 30 31  GLUCOSE 110* 87  BUN 16 15  CREATININE 0.83 0.77  CALCIUM 9.0 8.3*  MG  --  2.0  PHOS  --  4.2   GFR: Estimated Creatinine Clearance: 77.1 mL/min (by C-G formula based on SCr of 0.77 mg/dL). Liver Function Tests: Recent Labs  Lab 05/09/23 1815 05/10/23 0246  AST 13* 10*  ALT 9 9  ALKPHOS 82 70  BILITOT 1.1 0.9  PROT 7.5 6.5  ALBUMIN 3.2* 2.7*    Recent Labs  Lab 05/09/23 1815  LIPASE 35   No results for input(s): AMMONIA in the last 168 hours. Coagulation Profile: No results for input(s): INR, PROTIME in the last 168 hours. Cardiac Enzymes: No results for input(s): CKTOTAL, CKMB, CKMBINDEX, TROPONINI in the last 168 hours. BNP (last 3 results) No results for input(s): PROBNP in the last 8760 hours. HbA1C: No results for input(s): HGBA1C in the last 72 hours. CBG: No results for input(s): GLUCAP in the last 168 hours. Lipid Profile: No results for input(s): CHOL, HDL, LDLCALC, TRIG, CHOLHDL, LDLDIRECT in the last 72 hours. Thyroid Function Tests: No results for input(s): TSH, T4TOTAL, FREET4, T3FREE, THYROIDAB in the last 72 hours. Anemia Panel: Recent Labs    05/10/23 0246  VITAMINB12 230  FOLATE 4.2*   Sepsis Labs: No results for input(s): PROCALCITON, LATICACIDVEN in the last 168 hours.  Recent Results (from the past 240 hours)  Resp panel by RT-PCR (RSV, Flu A&B, Covid) Anterior Nasal Swab     Status: None   Collection Time: 05/09/23  5:20 PM   Specimen: Anterior Nasal Swab  Result Value Ref Range Status   SARS Coronavirus 2 by RT PCR NEGATIVE NEGATIVE Final    Comment: (NOTE) SARS-CoV-2 target nucleic acids are NOT DETECTED.  The SARS-CoV-2 RNA is generally detectable in upper respiratory specimens during the acute phase of infection. The lowest concentration of SARS-CoV-2 viral copies this assay can detect is 138 copies/mL. A negative result does not preclude SARS-Cov-2 infection and should not be used as the sole basis for treatment or other patient management decisions. A negative result may occur with  improper specimen collection/handling, submission of specimen other than nasopharyngeal swab, presence of viral mutation(s) within the areas targeted by this assay, and inadequate number of viral copies(<138 copies/mL). A negative result must be combined  with clinical observations, patient history, and epidemiological information. The expected result is Negative.  Fact Sheet for Patients:  bloggercourse.com  Fact Sheet for Healthcare Providers:  seriousbroker.it  This test is no t yet approved or cleared by the [place]  FDA and  has been authorized for detection and/or diagnosis of SARS-CoV-2 by FDA under an Emergency Use Authorization (EUA). This EUA will remain  in effect (meaning this test can be used) for the duration of the COVID-19 declaration under Section 564(b)(1) of the Act, 21 U.S.C.section 360bbb-3(b)(1), unless the authorization is terminated  or revoked sooner.       Influenza A by PCR NEGATIVE NEGATIVE Final   Influenza B by PCR NEGATIVE NEGATIVE Final    Comment: (NOTE) The Xpert Xpress SARS-CoV-2/FLU/RSV plus assay is intended as an aid in the diagnosis of influenza from Nasopharyngeal swab specimens and should not be used as a sole basis for treatment. Nasal washings and aspirates are unacceptable for Xpert Xpress SARS-CoV-2/FLU/RSV testing.  Fact Sheet for Patients: bloggercourse.com  Fact Sheet for Healthcare Providers: seriousbroker.it  This test is not yet approved or cleared by the [place]  FDA and has been authorized for detection and/or diagnosis of SARS-CoV-2 by FDA under an Emergency Use Authorization (EUA). This EUA will remain in effect (meaning this test can be used) for the duration of the COVID-19 declaration under Section 564(b)(1) of the Act, 21 U.S.C. section 360bbb-3(b)(1), unless the authorization is terminated or revoked.     Resp Syncytial Virus by PCR NEGATIVE NEGATIVE Final    Comment: (NOTE) Fact Sheet for Patients: bloggercourse.com  Fact Sheet for Healthcare Providers: seriousbroker.it  This test is not yet approved  or cleared by the [place]  FDA and has been authorized for detection and/or diagnosis of SARS-CoV-2 by FDA under an Emergency Use Authorization (EUA). This EUA will remain in effect (meaning this test can be used) for the duration of the COVID-19 declaration under Section 564(b)(1) of the Act, 21 U.S.C. section 360bbb-3(b)(1), unless the authorization is terminated or revoked.  Performed at [facility], [address] [place], [place] [address]          Radiology Studies: CT ABDOMEN PELVIS W CONTRAST Result Date: 05/09/2023 CLINICAL DATA:  Abdominal pain. Previous abdominal surgery. Vomiting, weight loss, decreased appetite, and dehydration. EXAM: CT ABDOMEN AND PELVIS WITH CONTRAST TECHNIQUE: Multidetector CT imaging of the abdomen and pelvis was performed using the standard protocol following bolus administration of intravenous contrast. RADIATION DOSE REDUCTION: This exam was performed according to the departmental dose-optimization program which includes automated exposure control, adjustment of the mA and/or kV according to patient size and/or use of iterative reconstruction technique. CONTRAST:  OMNIPAQUE  IOHEXOL  300 MG/ML  SOLN COMPARISON:  03/22/2023 FINDINGS: Lower chest: Lung bases are clear. Lower esophageal wall is thickened and edematous. This likely represents reflux disease or esophagitis. Hepatobiliary: No focal liver abnormality is seen. No gallstones, gallbladder wall thickening, or biliary dilatation. Pancreas: Unremarkable. No pancreatic ductal dilatation or surrounding inflammatory changes. Spleen: Normal in size without focal abnormality. Adrenals/Urinary Tract: No adrenal gland nodules. Kidneys are symmetrical. Nephrograms are homogeneous. No hydronephrosis or hydroureter. Bladder is decompressed. Stomach/Bowel: Stomach is not abnormally distended. There is evidence of edema in the pyloric and duodenal bulb region, likely inflammatory. There is a small gas and  fluid collection measuring 2.1 cm diameter in the region of the gastrohepatic ligament along the liver edge. This appears to communicate with the stomach and probably represents an ulcer. This is new since previous study. Small bowel are mostly decompressed. Decompression limits evaluation but small bowel wall appears thickened. This could indicate edema or enteritis. Scattered stool throughout the colon without abnormal distention. Appendix is not identified. Vascular/Lymphatic: Aortic atherosclerosis. No enlarged abdominal or pelvic lymph nodes. Reproductive: Prostate is unremarkable. Other: Moderate diffuse abdominal and pelvic ascites, increasing since previous study. No free air is identified. Musculoskeletal: No acute or significant osseous findings. IMPRESSION: 1. Edema around the pylorus and duodenal bulb with a small air-fluid collection along the gastrohepatic ligament likely representing a developing gastric ulcer. 2. Edema around the lower esophagus may represent reflux disease or esophagitis. 3. Mild wall thickening in the small bowel may indicate enteritis. 4. Moderate abdominal and pelvic ascites, increasing since prior study. 5. Aortic atherosclerosis. Electronically Signed   By: Elsie Gravely M.D.   On: 05/09/2023 20:46   DG Chest 2 View Result Date: 05/09/2023 CLINICAL DATA:  Weakness. EXAM: CHEST - 2 VIEW COMPARISON:  March 21, 2023. FINDINGS: The heart size and mediastinal contours are within normal limits. Both lungs are clear.  Old right rib fractures are noted. IMPRESSION: No active cardiopulmonary disease. Electronically Signed   By: Lynwood Landy Raddle M.D.   On: 05/09/2023 18:21        Scheduled Meds:  cyanocobalamin   1,000 mcg Oral Daily   feeding supplement  237 mL Oral BID BM   ferrous sulfate   325 mg Oral Q breakfast   folic acid   1 mg Oral Daily   potassium chloride   40 mEq Oral Once     LOS: 1 day    Time spent: 55 minutes    Shawan Tosh JONETTA Fairly, DO [facility]  If 7PM-7AM, please contact night-coverage [email or web address] 05/10/2023, 7:04 AM

## 2023-05-10 NOTE — ED Notes (Signed)
 Patient reports pain and nausea have improved at this time.  Reminded on NPO status.

## 2023-05-10 NOTE — ED Notes (Signed)
 ED TO INPATIENT HANDOFF REPORT  ED Nurse Name and Phone #: Annabella Hutchinson RN  S Name/Age/Gender Jeff Anderson 58 y.o. male Room/Bed: APA07/APA07  Code Status   Code Status: Full Code  Home/SNF/Other Home Patient oriented to: self, place, time, and situation Is this baseline? Yes   Triage Complete: Triage complete  Chief Complaint Gastric ulcer [K25.9]  Triage Note Pt arrived via POV c/o emesis, weight loss, decreased appetite and dehydration.    Allergies Allergies  Allergen Reactions   Codeine Nausea And Vomiting    Level of Care/Admitting Diagnosis ED Disposition     ED Disposition  Admit   Condition  --   Comment  Hospital Area: [facility] [[id number]]  Level of Care: Med-Surg [16]  Covid Evaluation: Asymptomatic - no recent exposure (last 10 days) testing not required  Diagnosis: Gastric ulcer [258454]  Admitting Physician: ADEFESO, OLADAPO [[id number]]  Attending Physician: ADEFESO, OLADAPO [[id number]]  Certification:: I certify this patient will need inpatient services for at least 2 midnights  Expected Medical Readiness: 05/12/2023          B Medical/Surgery History Past Medical History:  Diagnosis Date   Iron deficiency anemia    Past Surgical History:  Procedure Laterality Date   ANKLE SURGERY Left    ELBOW SURGERY       A IV Location/Drains/Wounds Patient Lines/Drains/Airways Status     Active Line/Drains/Airways     Name Placement date Placement time Site Days   Peripheral IV 05/09/23 20 G Right Forearm 05/09/23  2018  Forearm  1            Intake/Output Last 24 hours  Intake/Output Summary (Last 24 hours) at 05/10/2023 0217 Last data filed at 05/09/2023 2316 Gross per 24 hour  Intake 1100 ml  Output --  Net 1100 ml    Labs/Imaging Results for orders placed or performed during the hospital encounter of 05/09/23 (from the past 48 hours)  Urinalysis, Routine w reflex microscopic -Urine, Clean Catch     Status: Abnormal    Collection Time: 05/09/23  5:20 PM  Result Value Ref Range   Color, Urine AMBER (A) YELLOW    Comment: BIOCHEMICALS MAY BE AFFECTED BY COLOR   APPearance HAZY (A) CLEAR   Specific Gravity, Urine 1.024 1.005 - 1.030   pH 8.0 5.0 - 8.0   Glucose, UA NEGATIVE NEGATIVE mg/dL   Hgb urine dipstick NEGATIVE NEGATIVE   Bilirubin Urine SMALL (A) NEGATIVE   Ketones, ur 5 (A) NEGATIVE mg/dL   Protein, ur 899 (A) NEGATIVE mg/dL   Nitrite NEGATIVE NEGATIVE   Leukocytes,Ua NEGATIVE NEGATIVE   RBC / HPF 0-5 0 - 5 RBC/hpf   WBC, UA 0-5 0 - 5 WBC/hpf   Bacteria, UA NONE SEEN NONE SEEN   Squamous Epithelial / HPF 0-5 0 - 5 /HPF   Mucus PRESENT    Hyaline Casts, UA PRESENT     Comment: Performed at [facility], [address], [place], [place] [address]  Resp panel by RT-PCR (RSV, Flu A&B, Covid) Anterior Nasal Swab     Status: None   Collection Time: 05/09/23  5:20 PM   Specimen: Anterior Nasal Swab  Result Value Ref Range   SARS Coronavirus 2 by RT PCR NEGATIVE NEGATIVE    Comment: (NOTE) SARS-CoV-2 target nucleic acids are NOT DETECTED.  The SARS-CoV-2 RNA is generally detectable in upper respiratory specimens during the acute phase of infection. The lowest concentration of SARS-CoV-2 viral copies this assay can  detect is 138 copies/mL. A negative result does not preclude SARS-Cov-2 infection and should not be used as the sole basis for treatment or other patient management decisions. A negative result may occur with  improper specimen collection/handling, submission of specimen other than nasopharyngeal swab, presence of viral mutation(s) within the areas targeted by this assay, and inadequate number of viral copies(<138 copies/mL). A negative result must be combined with clinical observations, patient history, and epidemiological information. The expected result is Negative.  Fact Sheet for Patients:  bloggercourse.com  Fact Sheet for Healthcare Providers:   seriousbroker.it  This test is no t yet approved or cleared by the [place]  FDA and  has been authorized for detection and/or diagnosis of SARS-CoV-2 by FDA under an Emergency Use Authorization (EUA). This EUA will remain  in effect (meaning this test can be used) for the duration of the COVID-19 declaration under Section 564(b)(1) of the Act, 21 U.S.C.section 360bbb-3(b)(1), unless the authorization is terminated  or revoked sooner.       Influenza A by PCR NEGATIVE NEGATIVE   Influenza B by PCR NEGATIVE NEGATIVE    Comment: (NOTE) The Xpert Xpress SARS-CoV-2/FLU/RSV plus assay is intended as an aid in the diagnosis of influenza from Nasopharyngeal swab specimens and should not be used as a sole basis for treatment. Nasal washings and aspirates are unacceptable for Xpert Xpress SARS-CoV-2/FLU/RSV testing.  Fact Sheet for Patients: bloggercourse.com  Fact Sheet for Healthcare Providers: seriousbroker.it  This test is not yet approved or cleared by the [place]  FDA and has been authorized for detection and/or diagnosis of SARS-CoV-2 by FDA under an Emergency Use Authorization (EUA). This EUA will remain in effect (meaning this test can be used) for the duration of the COVID-19 declaration under Section 564(b)(1) of the Act, 21 U.S.C. section 360bbb-3(b)(1), unless the authorization is terminated or revoked.     Resp Syncytial Virus by PCR NEGATIVE NEGATIVE    Comment: (NOTE) Fact Sheet for Patients: bloggercourse.com  Fact Sheet for Healthcare Providers: seriousbroker.it  This test is not yet approved or cleared by the [place]  FDA and has been authorized for detection and/or diagnosis of SARS-CoV-2 by FDA under an Emergency Use Authorization (EUA). This EUA will remain in effect (meaning this test can be used) for the duration of  the COVID-19 declaration under Section 564(b)(1) of the Act, 21 U.S.C. section 360bbb-3(b)(1), unless the authorization is terminated or revoked.  Performed at [facility], [address], [place], [place] [address]   Comprehensive metabolic panel     Status: Abnormal   Collection Time: 05/09/23  6:15 PM  Result Value Ref Range   Sodium 137 135 - 145 mmol/L   Potassium 2.7 (LL) 3.5 - 5.1 mmol/L    Comment: CRITICAL RESULT CALLED TO, READ BACK BY AND VERIFIED WITH BELTON,C ON 05/09/23 AT 1835 BY LOY,C   Chloride 91 (L) 98 - 111 mmol/L   CO2 30 22 - 32 mmol/L   Glucose, Bld 110 (H) 70 - 99 mg/dL    Comment: Glucose reference range applies only to samples taken after fasting for at least 8 hours.   BUN 16 6 - 20 mg/dL   Creatinine, Ser 9.16 0.61 - 1.24 mg/dL   Calcium 9.0 8.9 - 89.6 mg/dL   Total Protein 7.5 6.5 - 8.1 g/dL   Albumin 3.2 (L) 3.5 - 5.0 g/dL   AST 13 (L) 15 - 41 U/L   ALT 9 0 - 44 U/L   Alkaline Phosphatase 82  38 - 126 U/L   Total Bilirubin 1.1 0.0 - 1.2 mg/dL   GFR, Estimated >39 >39 mL/min    Comment: (NOTE) Calculated using the CKD-EPI Creatinine Equation (2021)    Anion gap 16 (H) 5 - 15    Comment: Performed at [facility], [address] [place], [place] [address]  CBC with Differential     Status: Abnormal   Collection Time: 05/09/23  6:15 PM  Result Value Ref Range   WBC 7.6 4.0 - 10.5 K/uL   RBC 3.81 (L) 4.22 - 5.81 MIL/uL   Hemoglobin 13.0 13.0 - 17.0 g/dL   HCT 61.5 (L) 60.9 - 47.9 %   MCV 100.8 (H) 80.0 - 100.0 fL   MCH 34.1 (H) 26.0 - 34.0 pg   MCHC 33.9 30.0 - 36.0 g/dL   RDW 83.3 (H) 88.4 - 84.4 %   Platelets 413 (H) 150 - 400 K/uL   nRBC 0.0 0.0 - 0.2 %   Neutrophils Relative % 63 %   Neutro Abs 4.8 1.7 - 7.7 K/uL   Lymphocytes Relative 29 %   Lymphs Abs 2.2 0.7 - 4.0 K/uL   Monocytes Relative 8 %   Monocytes Absolute 0.6 0.1 - 1.0 K/uL   Eosinophils Relative 0 %   Eosinophils Absolute 0.0 0.0 - 0.5 K/uL   Basophils Relative 0 %    Basophils Absolute 0.0 0.0 - 0.1 K/uL   Immature Granulocytes 0 %   Abs Immature Granulocytes 0.02 0.00 - 0.07 K/uL    Comment: Performed at [facility], [address], [place], [place] [address]  Lipase, blood     Status: None   Collection Time: 05/09/23  6:15 PM  Result Value Ref Range   Lipase 35 11 - 51 U/L    Comment: Performed at [facility], [address], [place], [place] [address]   CT ABDOMEN PELVIS W CONTRAST Result Date: 05/09/2023 CLINICAL DATA:  Abdominal pain. Previous abdominal surgery. Vomiting, weight loss, decreased appetite, and dehydration. EXAM: CT ABDOMEN AND PELVIS WITH CONTRAST TECHNIQUE: Multidetector CT imaging of the abdomen and pelvis was performed using the standard protocol following bolus administration of intravenous contrast. RADIATION DOSE REDUCTION: This exam was performed according to the departmental dose-optimization program which includes automated exposure control, adjustment of the mA and/or kV according to patient size and/or use of iterative reconstruction technique. CONTRAST:  OMNIPAQUE  IOHEXOL  300 MG/ML  SOLN COMPARISON:  03/22/2023 FINDINGS: Lower chest: Lung bases are clear. Lower esophageal wall is thickened and edematous. This likely represents reflux disease or esophagitis. Hepatobiliary: No focal liver abnormality is seen. No gallstones, gallbladder wall thickening, or biliary dilatation. Pancreas: Unremarkable. No pancreatic ductal dilatation or surrounding inflammatory changes. Spleen: Normal in size without focal abnormality. Adrenals/Urinary Tract: No adrenal gland nodules. Kidneys are symmetrical. Nephrograms are homogeneous. No hydronephrosis or hydroureter. Bladder is decompressed. Stomach/Bowel: Stomach is not abnormally distended. There is evidence of edema in the pyloric and duodenal bulb region, likely inflammatory. There is a small gas and fluid collection measuring 2.1 cm diameter in the region of the gastrohepatic ligament along the  liver edge. This appears to communicate with the stomach and probably represents an ulcer. This is new since previous study. Small bowel are mostly decompressed. Decompression limits evaluation but small bowel wall appears thickened. This could indicate edema or enteritis. Scattered stool throughout the colon without abnormal distention. Appendix is not identified. Vascular/Lymphatic: Aortic atherosclerosis. No enlarged abdominal or pelvic lymph nodes. Reproductive: Prostate is unremarkable. Other: Moderate  diffuse abdominal and pelvic ascites, increasing since previous study. No free air is identified. Musculoskeletal: No acute or significant osseous findings. IMPRESSION: 1. Edema around the pylorus and duodenal bulb with a small air-fluid collection along the gastrohepatic ligament likely representing a developing gastric ulcer. 2. Edema around the lower esophagus may represent reflux disease or esophagitis. 3. Mild wall thickening in the small bowel may indicate enteritis. 4. Moderate abdominal and pelvic ascites, increasing since prior study. 5. Aortic atherosclerosis. Electronically Signed   By: Elsie Gravely M.D.   On: 05/09/2023 20:46   DG Chest 2 View Result Date: 05/09/2023 CLINICAL DATA:  Weakness. EXAM: CHEST - 2 VIEW COMPARISON:  March 21, 2023. FINDINGS: The heart size and mediastinal contours are within normal limits. Both lungs are clear. Old right rib fractures are noted. IMPRESSION: No active cardiopulmonary disease. Electronically Signed   By: Lynwood Landy Raddle M.D.   On: 05/09/2023 18:21    Pending Labs Unresulted Labs (From admission, onward)     Start     Ordered   05/10/23 0500  Folate  Tomorrow morning,   R        05/09/23 2305   05/10/23 0500  Vitamin B12  Tomorrow morning,   R        05/09/23 2305   05/10/23 0500  Comprehensive metabolic panel  Tomorrow morning,   R        05/09/23 2312   05/10/23 0500  CBC  Tomorrow morning,   R        05/09/23 2312   05/10/23 0500   Magnesium   Tomorrow morning,   R        05/09/23 2312   05/10/23 0500  Phosphorus  Tomorrow morning,   R        05/09/23 2312            Vitals/Pain Today's Vitals   05/10/23 0115 05/10/23 0130 05/10/23 0145 05/10/23 0200  BP: 124/82 115/87 130/81 121/78  Pulse: 66 63 61 60  Resp:    16  Temp:      TempSrc:      SpO2: 95% 95% 97% 95%  Weight:      Height:      PainSc:        Isolation Precautions No active isolations  Medications Medications  potassium chloride  SA (KLOR-CON  M) CR tablet 40 mEq (0 mEq Oral Hold 05/10/23 0154)  acetaminophen  (TYLENOL ) tablet 650 mg (has no administration in time range)    Or  acetaminophen  (TYLENOL ) suppository 650 mg (has no administration in time range)  prochlorperazine  (COMPAZINE ) injection 10 mg (has no administration in time range)  feeding supplement (ENSURE ENLIVE / ENSURE PLUS) liquid 237 mL (has no administration in time range)  cyanocobalamin  (VITAMIN B12) tablet 1,000 mcg (has no administration in time range)  ferrous sulfate  tablet 325 mg (has no administration in time range)  folic acid  (FOLVITE ) tablet 1 mg (has no administration in time range)  iohexol  (OMNIPAQUE ) 300 MG/ML solution 100 mL (100 mLs Intravenous Contrast Given 05/09/23 2026)  sodium chloride  0.9 % bolus 1,000 mL (0 mLs Intravenous Stopped 05/09/23 2316)  potassium chloride  10 mEq in 100 mL IVPB (0 mEq Intravenous Stopped 05/09/23 2316)  pantoprazole  (PROTONIX ) injection 40 mg (40 mg Intravenous Given 05/09/23 2133)  ondansetron  (ZOFRAN ) injection 4 mg (4 mg Intravenous Given 05/09/23 2140)    Mobility walks     Focused Assessments Neuro Assessment Handoff:  Swallow screen pass?  Not ordered  Neuro Assessment:   Neuro Checks:      Has TPA been given? No If patient is a Neuro Trauma and patient is going to OR before floor call report to 4N Charge nurse: [phone] or [phone]   R Recommendations: See Admitting Provider Note  Report given  to:   Additional Notes: .

## 2023-05-10 NOTE — Plan of Care (Signed)
   Problem: Education: Goal: Knowledge of General Education information will improve Description Including pain rating scale, medication(s)/side effects and non-pharmacologic comfort measures Outcome: Progressing   Problem: Health Behavior/Discharge Planning: Goal: Ability to manage health-related needs will improve Outcome: Progressing

## 2023-05-10 NOTE — Consult Note (Addendum)
 Gastroenterology Consult   Referring Provider: No ref. provider found Primary Care Physician:  Toribio Jerel MATSU, MD Primary Gastroenterologist:  previously unassigned  Patient ID: Jeff Anderson; [id number]; 1965-06-26   Admit date: 05/09/2023  LOS: 1 day   Date of Consultation: 05/10/2023  Reason for Consultation:  gastric ulcer    History of Present Illness   Jeff Anderson is a 58 y.o. male with past medical history of alcohol abuse, admissions back in December with suspected aspiration pneumonia, nausea/vomiting, closed fracture multiple ribs on the right side, history of prolonged QT interval in the setting of electrolyte abnormality/dehydration who presented to the emergency department yesterday due to emesis, weight loss, poor appetite.  In the ED: Afebrile.  Vital signs stable.  Urinalysis with no signs of infection, respiratory panel negative, potassium 2.7, creatinine 0.83, albumin 3.2 otherwise LFTs normal, white blood cell count 7600, hemoglobin 13 (was 8.2 in December), MCV 100.8, platelets 413,000, lipase normal.  Chest x-ray with no acute disease.  CT abdomen pelvis with contrast showing lower esophageal wall thickened and edematous, edema in the pyloric and duodenal bulb region likely inflammatory, small gas and fluid collection measuring 2.1 cm in the region of the gastrohepatic ligament along the liver edge appears to communicate with the stomach and probably represents an ulcer, new from previous study in December 2024.  Small bowel wall appears thickened but decompression movement stomach, cannot exclude enteritis.  Moderate abdominal and pelvic ascites increasing from prior study.  Labs from today folate 4.2 low up from 2.96-month prior, vitamin B12 230, potassium 3.1, albumin 2.7, hemoglobin 11.7, magnesium  2.0.  GI consult: Patient reports feeling unwell really dating back into 03/2023 after he fell and broke multiple anterior ribs 03/11/23. Admitted  12/15-12/17 and 12/18-12/22 with N/V, PNA. CT at that time with multifocal inflammatory change and free fluid interspersed between loops of colon and small bowel throughout the abdomen worsened from 12/15 study compatible with gastroenteritis, likely esophagitis and gastritis.    He states he has lost over 30 pounds. He has been having N/V since mid-December. Symptoms worsening over the past 2 weeks.  Constant epigastric/right upper quadrant pain.  Every time he tries to eat or drink he ends up vomiting.  Tries to eat few bites of cereal in the morning but throughout the day he gets weaker and ends up vomiting.  Has not been able to keep any significant amount of food/liquid down.  Really not moving his bowels well.  Last stool about 4 to 5 days ago.  Denies any melena or rectal bleeding.  He has noted coffee-ground emesis.  Denies dysphagia.  Patient states he quit alcohol on March 09, 2023.  Prior to this he was a heavy user.  Denies any current illicit drug use, history of remote cocaine use over 20 years ago. He smokes 3/4 pack of cigarettes daily.  He denies any NSAID or aspirin powder use.   No prior EGD or colonoscopy.     Prior to Admission medications   Medication Sig Start Date End Date Taking? Authorizing Provider  cyanocobalamin  1000 MCG tablet Take 1 tablet (1,000 mcg total) by mouth daily. 03/26/23  Yes Tobie Yetta HERO, MD  ferrous sulfate  325 (65 FE) MG tablet Take 1 tablet (325 mg total) by mouth daily with breakfast. 03/26/23  Yes Tobie Yetta HERO, MD  folic acid  (FOLVITE ) 1 MG tablet Take 1 tablet (1 mg total) by mouth daily. 03/20/23  Yes Ricky Fines, MD  mirtazapine (REMERON)  30 MG tablet Take 60 mg by mouth at bedtime. 04/19/23  Yes [provider]  nicotine  (NICODERM CQ  - DOSED IN MG/24 HOURS) 21 mg/24hr patch Place 1 patch (21 mg total) onto the skin daily. 03/26/23  Yes Tobie Yetta HERO, MD  pantoprazole  (PROTONIX ) 40 MG tablet Take 1 tablet (40 mg total) by mouth 2  (two) times daily before a meal. 03/25/23 03/24/24 Yes Tobie Yetta HERO, MD  sucralfate  (CARAFATE ) 1 g tablet Take 1 tablet (1 g total) by mouth 2 (two) times daily for 15 days. 03/25/23 05/09/23 Yes Patel, Pranav M, MD  traMADol (ULTRAM) 50 MG tablet Take 200 mg by mouth daily. 04/02/23  Yes [provider]    Current Facility-Administered Medications  Medication Dose Route Frequency Provider Last Rate Last Admin   0.9 %  sodium chloride  infusion   Intravenous Continuous Maree, Pratik D, DO       acetaminophen  (TYLENOL ) tablet 650 mg  650 mg Oral Q6H PRN Adefeso, Oladapo, DO       Or   acetaminophen  (TYLENOL ) suppository 650 mg  650 mg Rectal Q6H PRN Adefeso, Oladapo, DO       cyanocobalamin  (VITAMIN B12) tablet 1,000 mcg  1,000 mcg Oral Daily Adefeso, Oladapo, DO       feeding supplement (ENSURE ENLIVE / ENSURE PLUS) liquid 237 mL  237 mL Oral BID BM Adefeso, Oladapo, DO       ferrous sulfate  tablet 325 mg  325 mg Oral Q breakfast Adefeso, Oladapo, DO       folic acid  (FOLVITE ) tablet 1 mg  1 mg Oral Daily Adefeso, Oladapo, DO       pantoprazole  (PROTONIX ) injection 40 mg  40 mg Intravenous Daily Shah, Pratik D, DO       potassium chloride  SA (KLOR-CON  M) CR tablet 40 mEq  40 mEq Oral Once Maree, Pratik D, DO       prochlorperazine  (COMPAZINE ) injection 10 mg  10 mg Intravenous Q6H PRN Adefeso, Oladapo, DO        Allergies as of 05/09/2023 - Review Complete 05/09/2023  Allergen Reaction Noted   Codeine Nausea And Vomiting 07/15/2014    Past Medical History:  Diagnosis Date   Iron deficiency anemia     Past Surgical History:  Procedure Laterality Date   ANKLE SURGERY Left    ELBOW SURGERY      History reviewed. No pertinent family history.  Social History   Socioeconomic History   Marital status: Married    Spouse name: Not on file   Number of children: Not on file   Years of education: Not on file   Highest education level: Not on file  Occupational History   Not  on file  Tobacco Use   Smoking status: Every Day    Current packs/day: 1.50    Average packs/day: 1.5 packs/day for 30.0 years (45.0 ttl pk-yrs)    Types: Cigarettes   Smokeless tobacco: Never  Vaping Use   Vaping status: Never Used  Substance and Sexual Activity   Alcohol use: Yes    Alcohol/week: 3.0 standard drinks of alcohol    Types: 3 Cans of beer per week   Drug use: Not Currently    Comment: past cocaine use - last used 20 years ago, last used marijuana in 1986   Sexual activity: Yes    Birth control/protection: None  Other Topics Concern   Not on file  Social History Narrative   Not on file  Social Drivers of Corporate Investment Banker Strain: Not on file  Food Insecurity: No Food Insecurity (05/10/2023)   Hunger Vital Sign    Worried About Running Out of Food in the Last Year: Never true    Ran Out of Food in the Last Year: Never true  Transportation Needs: No Transportation Needs (05/10/2023)   PRAPARE - Administrator, Civil Service (Medical): No    Lack of Transportation (Non-Medical): No  Physical Activity: Not on file  Stress: Not on file  Social Connections: Not on file  Intimate Partner Violence: Not At Risk (05/10/2023)   Humiliation, Afraid, Rape, and Kick questionnaire    Fear of Current or Ex-Partner: No    Emotionally Abused: No    Physically Abused: No    Sexually Abused: No     Review of System:   General: Negative for  fever, chills.  See HPI Eyes: Negative for vision changes.  ENT: Negative for hoarseness, difficulty swallowing , nasal congestion. CV: Negative for chest pain, angina, palpitations, dyspnea on exertion, peripheral edema.  Respiratory: Negative for dyspnea at rest, dyspnea on exertion, cough, sputum, wheezing.  GI: See history of present illness. GU:  Negative for dysuria, hematuria, urinary incontinence, urinary frequency, nocturnal urination.  MS: Negative for joint pain, low back pain.  Derm: Negative for rash or  itching.  Neuro: Negative for weakness, abnormal sensation, seizure, frequent headaches, memory loss, confusion.  Psych: Negative for anxiety, depression, suicidal ideation, hallucinations.  Endo: See HPI Heme: Negative for bruising or bleeding. Allergy: Negative for rash or hives.      Physical Examination:   Vital signs in last 24 hours: Temp:  [97.9 F (36.6 C)-98.6 F (37 C)] 98 F (36.7 C) (02/06 0731) Pulse Rate:  [58-104] 58 (02/06 0731) Resp:  [16-20] 20 (02/06 0302) BP: (115-160)/(78-102) 133/88 (02/06 0731) SpO2:  [94 %-100 %] 100 % (02/06 0731) Weight:  [53.5 kg] 53.5 kg (02/05 1711)    General: thin, well-developed in no acute distress.  Head: Normocephalic, atraumatic.   Eyes: Conjunctiva pink, no icterus. Mouth: Oropharyngeal mucosa moist and pink  Neck: Supple without thyromegaly, masses, or lymphadenopathy.  Lungs: Clear to auscultation bilaterally.  Heart: Regular rate and rhythm, no murmurs rubs or gallops.  Abdomen: Bowel sounds are normal,  nondistended, no hepatosplenomegaly or masses, no abdominal bruits or hernia , no rebound or guarding.  Mild epigastric/ruq tenderness.  Rectal: not performed Extremities: No lower extremity edema, clubbing, deformity.  Neuro: Alert and oriented x 4 , grossly normal neurologically.  Skin: Warm and dry, no rash or jaundice.   Psych: Alert and cooperative, normal mood and affect.        Intake/Output from previous day: 02/05 0701 - 02/06 0700 In: 1100 [IV Piggyback:1100] Out: -  Intake/Output this shift: No intake/output data recorded.  Lab Results:   CBC Recent Labs    05/09/23 1815 05/10/23 0246  WBC 7.6 7.0  HGB 13.0 11.7*  HCT 38.4* 34.8*  MCV 100.8* 103.0*  PLT 413* 320   BMET Recent Labs    05/09/23 1815 05/10/23 0246  NA 137 134*  K 2.7* 3.1*  CL 91* 95*  CO2 30 31  GLUCOSE 110* 87  BUN 16 15  CREATININE 0.83 0.77  CALCIUM 9.0 8.3*   LFT Recent Labs    05/09/23 1815 05/10/23 0246   BILITOT 1.1 0.9  ALKPHOS 82 70  AST 13* 10*  ALT 9 9  PROT 7.5 6.5  ALBUMIN  3.2* 2.7*    Lipase Recent Labs    05/09/23 1815  LIPASE 35    PT/INR No results for input(s): LABPROT, INR in the last 72 hours.   Hepatitis Panel No results for input(s): HEPBSAG, HCVAB, HEPAIGM, HEPBIGM in the last 72 hours.   Imaging Studies:   CT ABDOMEN PELVIS W CONTRAST Result Date: 05/09/2023 CLINICAL DATA:  Abdominal pain. Previous abdominal surgery. Vomiting, weight loss, decreased appetite, and dehydration. EXAM: CT ABDOMEN AND PELVIS WITH CONTRAST TECHNIQUE: Multidetector CT imaging of the abdomen and pelvis was performed using the standard protocol following bolus administration of intravenous contrast. RADIATION DOSE REDUCTION: This exam was performed according to the departmental dose-optimization program which includes automated exposure control, adjustment of the mA and/or kV according to patient size and/or use of iterative reconstruction technique. CONTRAST:  OMNIPAQUE  IOHEXOL  300 MG/ML  SOLN COMPARISON:  03/22/2023 FINDINGS: Lower chest: Lung bases are clear. Lower esophageal wall is thickened and edematous. This likely represents reflux disease or esophagitis. Hepatobiliary: No focal liver abnormality is seen. No gallstones, gallbladder wall thickening, or biliary dilatation. Pancreas: Unremarkable. No pancreatic ductal dilatation or surrounding inflammatory changes. Spleen: Normal in size without focal abnormality. Adrenals/Urinary Tract: No adrenal gland nodules. Kidneys are symmetrical. Nephrograms are homogeneous. No hydronephrosis or hydroureter. Bladder is decompressed. Stomach/Bowel: Stomach is not abnormally distended. There is evidence of edema in the pyloric and duodenal bulb region, likely inflammatory. There is a small gas and fluid collection measuring 2.1 cm diameter in the region of the gastrohepatic ligament along the liver edge. This appears to communicate with  the stomach and probably represents an ulcer. This is new since previous study. Small bowel are mostly decompressed. Decompression limits evaluation but small bowel wall appears thickened. This could indicate edema or enteritis. Scattered stool throughout the colon without abnormal distention. Appendix is not identified. Vascular/Lymphatic: Aortic atherosclerosis. No enlarged abdominal or pelvic lymph nodes. Reproductive: Prostate is unremarkable. Other: Moderate diffuse abdominal and pelvic ascites, increasing since previous study. No free air is identified. Musculoskeletal: No acute or significant osseous findings. IMPRESSION: 1. Edema around the pylorus and duodenal bulb with a small air-fluid collection along the gastrohepatic ligament likely representing a developing gastric ulcer. 2. Edema around the lower esophagus may represent reflux disease or esophagitis. 3. Mild wall thickening in the small bowel may indicate enteritis. 4. Moderate abdominal and pelvic ascites, increasing since prior study. 5. Aortic atherosclerosis. Electronically Signed   By: Elsie Gravely M.D.   On: 05/09/2023 20:46   DG Chest 2 View Result Date: 05/09/2023 CLINICAL DATA:  Weakness. EXAM: CHEST - 2 VIEW COMPARISON:  March 21, 2023. FINDINGS: The heart size and mediastinal contours are within normal limits. Both lungs are clear. Old right rib fractures are noted. IMPRESSION: No active cardiopulmonary disease. Electronically Signed   By: Lynwood Landy Raddle M.D.   On: 05/09/2023 18:21  [4 week]  Assessment:   58 year old male with history of alcohol abuse, with 2 admissions back in December after right anterior lateral rib fractures, N/V, PNA, presenting now with ongoing vomiting, abdominal pain, reported 30 pound weight loss in the past two months. GI consulted for gastric ulcer.  Abdominal pain/vomiting/weight loss: -CT with edema in the pyloric and duodenal bulb region likely inflammatory.  Small gas and fluid collection  measuring 2.1 cm in the region of the gastrohepatic ligament along the liver edge which appears to communicate with the stomach and probably represents an ulcer.  Personally reviewed CT with radiologist, Dr. Camellia Candle.  Concern for contained perforation of prepyloric/antral ulcer now with fluid tracking under surface of liver in the gastrohepatic ligament area also with inflammation and fluid/air with duodenal bulb. He has marked the coronal images series 4, image 34, 36. Patient also with new moderate ascites.  -Edema around the lower esophagus -Mild wall thickening in the small bowel possibly due to underdistention but cannot exclude enteritis -Patient presenting with found hypokalemia, potassium has been corrected from 2.7 up to 3.1.  Magnesium  normal. -clinically patient does not present with peritonitis.  -findings discussed with Dr. Cinderella, who will be discussing with general surgery. May need to hold off on EGD due to concern for contained perforation.  Plan:   Npo for now.  IV pantoprazole  BID. Replete potassium. Await further input regarding timing of EGD.   LOS: 1 day   We would like to thank you for the opportunity to participate in the care of Ormand Senn.  Sonny RAMAN. Ezzard RIGGERS [facility] [phone] 2/6/20257:42 AM

## 2023-05-10 NOTE — Progress Notes (Addendum)
 Initial Nutrition Assessment  DOCUMENTATION CODES:   Severe malnutrition in context of acute illness/injury, Underweight  INTERVENTION:   Recommend resume regular diet as able after EGD. When diet advanced, resume Ensure Enlive po TID, each supplement provides 350 kcal and 20 grams of protein. Monitor magnesium , potassium, and phosphorus for at least 3 days, MD to replete as needed, as pt is at risk for refeeding syndrome given minimal intake for the past 2 weeks, severe weight loss. Add Thiamine  100 mg daily for 7 days.  NUTRITION DIAGNOSIS:   Severe Malnutrition related to acute illness (recent PNA and suspected gastric ulcer) as evidenced by severe muscle depletion, severe fat depletion, percent weight loss (22% weight loss within the past 2 months).  GOAL:   Patient will meet greater than or equal to 90% of their needs  MONITOR:   Diet advancement, PO intake, Supplement acceptance  REASON FOR ASSESSMENT:   Consult Assessment of nutrition requirement/status  ASSESSMENT:   58 yo male admitted with FTT with 2 week hx of vomiting and abdominal pain. PMH includes iron deficiency anemia, smoker, former alcohol abuse (quit Dec 2024).  Patient is unhappy that he cannot have something to eat at this time. He is extremely hungry. Reports poor appetite and decreased intake for the past month or so d/t PNA, then current illness with N/V. 34 lb weight loss reported since December 2024. For the past month, he has been eating cereal for breakfast, then nibbles for a few hours, then does not eat anything else that day. He does drink protein shakes sometimes. He stopped drinking alcohol in December 2024, but was a heavy drinker prior to that.   Currently NPO for possible EGD today.    Labs reviewed. Na 134, K 3.1, folate 4.2  Medications reviewed and include vitamin B-12, ferrous sulfate , folic acid , protonix , klor-con . IVF: NS @ 75 ml/h  Weight history reviewed. In the past 2 months,  patient has had 22% weight loss.   Patient meets criteria for severe malnutrition, given severe depletion of muscle and subcutaneous fat mass with 22% weight loss within 2 months. Suspect a chronic component to malnutrition as well, given hx of alcohol abuse.   NUTRITION - FOCUSED PHYSICAL EXAM:  Flowsheet Row Most Recent Value  Orbital Region Severe depletion  Upper Arm Region Severe depletion  Thoracic and Lumbar Region Severe depletion  Buccal Region Severe depletion  Temple Region Severe depletion  Clavicle Bone Region Severe depletion  Clavicle and Acromion Bone Region Severe depletion  Scapular Bone Region Severe depletion  Dorsal Hand Severe depletion  Patellar Region Severe depletion  Anterior Thigh Region Severe depletion  Posterior Calf Region Severe depletion  Edema (RD Assessment) None  Hair Reviewed  Eyes Reviewed  Mouth Reviewed  Skin Reviewed  Nails Reviewed       Diet Order:   Diet Order             Diet NPO time specified Except for: Sips with Meds  Diet effective midnight                   EDUCATION NEEDS:   Not appropriate for education at this time  Skin:  Skin Assessment: Reviewed RN Assessment  Last BM:  no BM documented  Height:   Ht Readings from Last 1 Encounters:  05/09/23 5' 11 (1.803 m)    Weight:   Wt Readings from Last 1 Encounters:  05/09/23 53.5 kg    Ideal Body Weight:  78.2 kg  BMI:  Body mass index is 16.46 kg/m.  Estimated Nutritional Needs:   Kcal:  2000-2200  Protein:  90-105 gm  Fluid:  2-2.2 L   Suzen HUNT RD, LDN, CNSC Contact via secure chat. If unavailable, use group chat RD Inpatient.

## 2023-05-10 NOTE — Progress Notes (Signed)
 This nurse walked into patients room at about 1930 and observed this patient eating [employer] chicken and mashed potatoes. This nurse educated patient on his liquid diet and the risk associated with not folling doctors orders. Patient asked for an ice cream and cookies. Patient re-educated. Patient refused to throw [facility] away.  patients wife and child at bedside, wife stated he shouldn't have to worrry about it because they are changing his diet order in the morning.

## 2023-05-11 ENCOUNTER — Encounter (HOSPITAL_COMMUNITY): Payer: Self-pay | Admitting: Internal Medicine

## 2023-05-11 ENCOUNTER — Inpatient Hospital Stay (HOSPITAL_COMMUNITY): Payer: PRIVATE HEALTH INSURANCE

## 2023-05-11 LAB — BODY FLUID CELL COUNT WITH DIFFERENTIAL
Eos, Fluid: 0 %
Lymphs, Fluid: 93 %
Monocyte-Macrophage-Serous Fluid: 6 % — ABNORMAL LOW (ref 50–90)
Neutrophil Count, Fluid: 1 % (ref 0–25)
Total Nucleated Cell Count, Fluid: 516 uL (ref 0–1000)

## 2023-05-11 LAB — COMPREHENSIVE METABOLIC PANEL
ALT: 8 U/L (ref 0–44)
AST: 11 U/L — ABNORMAL LOW (ref 15–41)
Albumin: 2.2 g/dL — ABNORMAL LOW (ref 3.5–5.0)
Alkaline Phosphatase: 58 U/L (ref 38–126)
Anion gap: 5 (ref 5–15)
BUN: 11 mg/dL (ref 6–20)
CO2: 24 mmol/L (ref 22–32)
Calcium: 7.7 mg/dL — ABNORMAL LOW (ref 8.9–10.3)
Chloride: 103 mmol/L (ref 98–111)
Creatinine, Ser: 0.59 mg/dL — ABNORMAL LOW (ref 0.61–1.24)
GFR, Estimated: >60 mL/min (ref >60)
Glucose, Bld: 84 mg/dL (ref 70–99)
Potassium: 3.4 mmol/L — ABNORMAL LOW (ref 3.5–5.1)
Sodium: 132 mmol/L — ABNORMAL LOW (ref 135–145)
Total Bilirubin: 0.7 mg/dL (ref 0.0–1.2)
Total Protein: 5.3 g/dL — ABNORMAL LOW (ref 6.5–8.1)

## 2023-05-11 LAB — GLUCOSE, CAPILLARY
Glucose-Capillary: 113 mg/dL — ABNORMAL HIGH (ref 70–99)
Glucose-Capillary: 129 mg/dL — ABNORMAL HIGH (ref 70–99)
Glucose-Capillary: 85 mg/dL (ref 70–99)
Glucose-Capillary: 89 mg/dL (ref 70–99)
Glucose-Capillary: 93 mg/dL (ref 70–99)
Glucose-Capillary: 99 mg/dL (ref 70–99)

## 2023-05-11 LAB — CBC
HCT: 31.6 % — ABNORMAL LOW (ref 39.0–52.0)
Hemoglobin: 10.1 g/dL — ABNORMAL LOW (ref 13.0–17.0)
MCH: 32.9 pg (ref 26.0–34.0)
MCHC: 32 g/dL (ref 30.0–36.0)
MCV: 102.9 fL — ABNORMAL HIGH (ref 80.0–100.0)
Platelets: 285 10*3/uL (ref 150–400)
RBC: 3.07 MIL/uL — ABNORMAL LOW (ref 4.22–5.81)
RDW: 15.8 % — ABNORMAL HIGH (ref 11.5–15.5)
WBC: 4.7 10*3/uL (ref 4.0–10.5)
nRBC: 0.4 % — ABNORMAL HIGH (ref 0.0–0.2)

## 2023-05-11 LAB — ALBUMIN, PLEURAL OR PERITONEAL FLUID: Albumin, Fluid: 2 g/dL

## 2023-05-11 LAB — LACTATE DEHYDROGENASE, PLEURAL OR PERITONEAL FLUID: LD, Fluid: 120 U/L — ABNORMAL HIGH (ref 3–23)

## 2023-05-11 LAB — PROTEIN, PLEURAL OR PERITONEAL FLUID: Total protein, fluid: 4.5 g/dL

## 2023-05-11 LAB — MAGNESIUM: Magnesium: 1.8 mg/dL (ref 1.7–2.4)

## 2023-05-11 MED ORDER — SUCRALFATE 1 GM/10ML PO SUSP
1.0000 g | Freq: Three times a day (TID) | ORAL | Status: DC
  Administered 2023-05-11 – 2023-05-12 (×5): 1 g via ORAL
  Filled 2023-05-11 (×5): qty 10

## 2023-05-11 MED ORDER — POTASSIUM CHLORIDE CRYS ER 20 MEQ PO TBCR
40.0000 meq | EXTENDED_RELEASE_TABLET | Freq: Two times a day (BID) | ORAL | Status: AC
  Administered 2023-05-11 (×2): 40 meq via ORAL
  Filled 2023-05-11 (×2): qty 2

## 2023-05-11 MED ORDER — ALUM & MAG HYDROXIDE-SIMETH 200-200-20 MG/5ML PO SUSP
30.0000 mL | ORAL | Status: DC | PRN
  Administered 2023-05-11: 30 mL via ORAL
  Filled 2023-05-11: qty 30

## 2023-05-11 NOTE — Progress Notes (Signed)
 Patient tolerated right sided paracentesis procedure well today and 450 mL of dark yellow ascites removed and samples sent to lab for processing. Patient verbalized understanding of post procedure instructions and transported back to inpatient bed assignment at this time via stretcher with no acute distress noted.

## 2023-05-11 NOTE — Procedures (Signed)
 Ultrasound-guided diagnostic and therapeutic paracentesis performed yielding  of amber colored fluid.  Fluid was sent to lab for analysis. No immediate complications. EBL is none.

## 2023-05-11 NOTE — TOC Initial Note (Signed)
 Transition of Care [facility]) - Initial/Assessment Note    Patient Details  Name: Jeff Anderson MRN: [id number] Date of Birth: 1965-09-19  [facility]) CM/SW Contact:    Lucie Lunger, LCSWA Phone Number: 05/11/2023, 11:07 AM  Clinical Narrative:                 Pt is high risk for readmission. CSW spoke with pt to complete assessment. Pt lives with his spouse. Pt is independent in completing his ADLs and drives to appointments when needed. Pt has not had HH and does not use any DME. CSW inquired about no insurance listed in chart, pt states he has insurance but his wife has the card and will bring it to hospital. TOC to follow.   Expected Discharge Plan: Home/Self Care Barriers to Discharge: Continued Medical Work up   Patient Goals and CMS Choice Patient states their goals for this hospitalization and ongoing recovery are:: return home CMS Medicare.gov Compare Post Acute Care list provided to:: Patient Choice offered to / list presented to : Patient      Expected Discharge Plan and Services In-house Referral: Clinical Social Work Discharge Planning Services: CM Consult   Living arrangements for the past 2 months: Single Family Home                                      Prior Living Arrangements/Services Living arrangements for the past 2 months: Single Family Home Lives with:: Spouse Patient language and need for interpreter reviewed:: Yes Do you feel safe going back to the place where you live?: Yes      Need for Family Participation in Patient Care: Yes (Comment) Care giver support system in place?: Yes (comment)   Criminal Activity/Legal Involvement Pertinent to Current Situation/Hospitalization: No - Comment as needed  Activities of Daily Living   ADL Screening (condition at time of admission) Independently performs ADLs?: Yes (appropriate for developmental age) Is the patient deaf or have difficulty hearing?: No Does the patient have  difficulty seeing, even when wearing glasses/contacts?: No Does the patient have difficulty concentrating, remembering, or making decisions?: No  Permission Sought/Granted                  Emotional Assessment Appearance:: Appears stated age Attitude/Demeanor/Rapport: Engaged Affect (typically observed): Accepting Orientation: : Oriented to Self, Oriented to Place, Oriented to  Time, Oriented to Situation Alcohol / Substance Use: Not Applicable Psych Involvement: No (comment)  Admission diagnosis:  Acute gastric ulcer without hemorrhage or perforation [K25.3] Gastric ulcer [K25.9] Patient Active Problem List   Diagnosis Date Noted   Gastric ulcer 05/09/2023   GERD (gastroesophageal reflux disease) 05/09/2023   Thrombocytosis 05/09/2023   Underweight (BMI < 18.5) 05/09/2023   Failure to thrive in adult 05/09/2023   Hypoalbuminemia due to protein-calorie malnutrition (HCC) 05/09/2023   Alcohol abuse 05/09/2023   HCAP (healthcare-associated pneumonia) 03/22/2023   Nausea & vomiting 03/18/2023   Acute gastroenteritis 03/18/2023   Hypokalemia 03/18/2023   Prolonged QT interval 03/18/2023   Constipation 03/18/2023   Macrocytic anemia 03/18/2023   Closed fracture of multiple ribs of right side 03/18/2023   PCP:  Toribio Jerel MATSU, MD Pharmacy:   [facility] [place], [place] - [address] [place] [address] Phone: [phone] Fax: [phone]  [id number] - [place], [place] - [address] AT [facility] OF  [address] [place] [address] Phone: [phone] Fax: [phone]     Social Drivers of Health (SDOH) Social History: SDOH Screenings   Food Insecurity: No Food Insecurity (05/10/2023)  Housing: Low Risk  (05/10/2023)  Transportation Needs: No Transportation Needs (05/10/2023)  Utilities: Not At Risk (05/10/2023)  Depression (PHQ2-9): Low Risk  (08/09/2021)  Tobacco Use: High  Risk (05/11/2023)   SDOH Interventions:     Readmission Risk Interventions    05/11/2023   11:06 AM  Readmission Risk Prevention Plan  Transportation Screening Complete  HRI or Home Care Consult Complete  Social Work Consult for Recovery Care Planning/Counseling Complete  Palliative Care Screening Not Applicable  Medication Review Oceanographer) Complete

## 2023-05-11 NOTE — Plan of Care (Signed)

## 2023-05-11 NOTE — Progress Notes (Signed)
 Gastroenterology Progress Note   Referring Provider: No ref. provider found Primary Care Physician:  Toribio Jerel MATSU, MD Primary Gastroenterologist: Muhammad Faizan Ahmed, MD (newly assigned)  Patient ID: Jeff Anderson; [id number]; 08/28/1965    Subjective   Feeling well overall today. Tolerating clear liquids without difficulty. Had some mashed potatoes from [facility] last night and has not had any pain. No nausea or vomiting. + flatus.   Reports smoking 3/4 ppd, no alcohol use. Denies NSAID or ASA powder use. States currently using nicotine  patches to help cut back. Reports a 30lb weight loss since December secondary to multiple acute illnesses and hospital stays.   Objective   Vital signs in last 24 hours Temp:  [97.8 F (36.6 C)-98 F (36.7 C)] 98 F (36.7 C) (02/07 0830) Pulse Rate:  [53-66] 58 (02/07 0840) Resp:  [18-20] 18 (02/07 0840) BP: (113-129)/(80-86) 126/80 (02/07 0840) SpO2:  [98 %-100 %] 100 % (02/07 0840)    Physical Exam General:   Alert and oriented, pleasant. Thin appearing.  Head:  Normocephalic and atraumatic. Eyes:  No icterus, sclera clear. Conjuctiva pink.  Mouth:  Without lesions, mucosa pink and moist.  Neck:  Supple, without thyromegaly or masses.  Abdomen:  Bowel sounds present, soft, non-tender, non-distended. No HSM or hernias noted. No rebound or guarding. No masses appreciated  Msk:  Symmetrical without gross deformities. Normal posture. Decreased muscle mass.  Neurologic:  Alert and  oriented x4;  grossly normal neurologically. Skin:  Warm and dry, intact without significant lesions.  Psych:  Alert and cooperative. Normal mood and affect.  Intake/Output from previous day: 02/06 0701 - 02/07 0700 In: 1492.5 [P.O.:120; I.V.:1372.5] Out: -  Intake/Output this shift: Total I/O In: 240 [P.O.:240] Out: -   Lab Results  Recent Labs    05/09/23 1815 05/10/23 0246 05/11/23 0457  WBC 7.6 7.0 4.7  HGB 13.0 11.7* 10.1*  HCT 38.4* 34.8*  31.6*  PLT 413* 320 285   BMET Recent Labs    05/09/23 1815 05/10/23 0246 05/11/23 0457  NA 137 134* 132*  K 2.7* 3.1* 3.4*  CL 91* 95* 103  CO2 30 31 24   GLUCOSE 110* 87 84  BUN 16 15 11   CREATININE 0.83 0.77 0.59*  CALCIUM 9.0 8.3* 7.7*   LFT Recent Labs    05/09/23 1815 05/10/23 0246 05/11/23 0457  PROT 7.5 6.5 5.3*  ALBUMIN 3.2* 2.7* 2.2*  AST 13* 10* 11*  ALT 9 9 8   ALKPHOS 82 70 58  BILITOT 1.1 0.9 0.7   PT/INR No results for input(s): LABPROT, INR in the last 72 hours. Hepatitis Panel No results for input(s): HEPBSAG, HCVAB, HEPAIGM, HEPBIGM in the last 72 hours.  Studies/Results US  Paracentesis Result Date: 05/11/2023 INDICATION: 58 y.o. Male. History of alcohol abuse. Presented to the ED with abdominal pain and vomiting found to have ascites. Admitted for failure to Thrive. Request is for therapeutic and diagnostic paracentesis EXAM: ULTRASOUND GUIDED THERAPEUTIC AND DIAGNOSTIC PARACENTESIS MEDICATIONS: Lidocaine  1%.  10 ml COMPLICATIONS: None immediate. PROCEDURE: Informed written consent was obtained from the patient after a discussion of the risks, benefits and alternatives to treatment. A timeout was performed prior to the initiation of the procedure. Initial ultrasound scanning demonstrates a small amount of ascites within the right lower abdominal quadrant. The right lower abdomen was prepped and draped in the usual sterile fashion. 1% lidocaine  was used for local anesthesia. Following this, a 19 gauge, 7-cm, Yueh catheter was introduced. An ultrasound image was saved  for documentation purposes. The paracentesis was performed. The catheter was removed and a dressing was applied. The patient tolerated the procedure well without immediate post procedural complication. FINDINGS: A total of approximately 450 ml of amber fluid was removed. Samples were sent to the laboratory as requested by the clinical team. IMPRESSION: Successful ultrasound-guided  paracentesis yielding 450 milliliters of peritoneal fluid. Performed by: Delon Beagle NP Electronically Signed   By: Marcey Moan M.D.   On: 05/11/2023 09:58   CT ABDOMEN PELVIS W CONTRAST Result Date: 05/09/2023 CLINICAL DATA:  Abdominal pain. Previous abdominal surgery. Vomiting, weight loss, decreased appetite, and dehydration. EXAM: CT ABDOMEN AND PELVIS WITH CONTRAST TECHNIQUE: Multidetector CT imaging of the abdomen and pelvis was performed using the standard protocol following bolus administration of intravenous contrast. RADIATION DOSE REDUCTION: This exam was performed according to the departmental dose-optimization program which includes automated exposure control, adjustment of the mA and/or kV according to patient size and/or use of iterative reconstruction technique. CONTRAST:  OMNIPAQUE  IOHEXOL  300 MG/ML  SOLN COMPARISON:  03/22/2023 FINDINGS: Lower chest: Lung bases are clear. Lower esophageal wall is thickened and edematous. This likely represents reflux disease or esophagitis. Hepatobiliary: No focal liver abnormality is seen. No gallstones, gallbladder wall thickening, or biliary dilatation. Pancreas: Unremarkable. No pancreatic ductal dilatation or surrounding inflammatory changes. Spleen: Normal in size without focal abnormality. Adrenals/Urinary Tract: No adrenal gland nodules. Kidneys are symmetrical. Nephrograms are homogeneous. No hydronephrosis or hydroureter. Bladder is decompressed. Stomach/Bowel: Stomach is not abnormally distended. There is evidence of edema in the pyloric and duodenal bulb region, likely inflammatory. There is a small gas and fluid collection measuring 2.1 cm diameter in the region of the gastrohepatic ligament along the liver edge. This appears to communicate with the stomach and probably represents an ulcer. This is new since previous study. Small bowel are mostly decompressed. Decompression limits evaluation but small bowel wall appears thickened.  This could indicate edema or enteritis. Scattered stool throughout the colon without abnormal distention. Appendix is not identified. Vascular/Lymphatic: Aortic atherosclerosis. No enlarged abdominal or pelvic lymph nodes. Reproductive: Prostate is unremarkable. Other: Moderate diffuse abdominal and pelvic ascites, increasing since previous study. No free air is identified. Musculoskeletal: No acute or significant osseous findings. IMPRESSION: 1. Edema around the pylorus and duodenal bulb with a small air-fluid collection along the gastrohepatic ligament likely representing a developing gastric ulcer. 2. Edema around the lower esophagus may represent reflux disease or esophagitis. 3. Mild wall thickening in the small bowel may indicate enteritis. 4. Moderate abdominal and pelvic ascites, increasing since prior study. 5. Aortic atherosclerosis. Electronically Signed   By: Elsie Gravely M.D.   On: 05/09/2023 20:46   DG Chest 2 View Result Date: 05/09/2023 CLINICAL DATA:  Weakness. EXAM: CHEST - 2 VIEW COMPARISON:  March 21, 2023. FINDINGS: The heart size and mediastinal contours are within normal limits. Both lungs are clear. Old right rib fractures are noted. IMPRESSION: No active cardiopulmonary disease. Electronically Signed   By: Lynwood Landy Raddle M.D.   On: 05/09/2023 18:21    Assessment  58 y.o. male with a history of alcohol abuse with 2 admissions in December 2024 for rib fractures, N/V, pneumonia who presented to the ED with 30 pound weight loss, vomiting, abdominal pain for 2 months. GI consulted given concerns for gastric ulcer on imaging.  Abdominal pain/vomiting/weight loss: CT with edema in pyloric and duodenal bulb - likely inflammatory. Small gas and fluid collection measuring 2.1 cm in the region of the gastrohepatic  ligament along the liver edge with appears to communicate with the stomach and probably represents an ulcer. Sonny Kerns PA-C reviewed with radiologist who feels as though  this is concerning for contained perforation of the prepyloric/antral ulcer with fluid tracking under the liver and with inflammation and fluid/air with the duodenal bulb. Also was noted to have new moderate ascites.he was also noted to have edema around the esophagus and mild wall thickening in the small bowel possible due to under distention but not able to exclude enteritis.   Presented with hypokalemia which has been partially corrected, K 3.4 today up from 2.7 on admission. Case was discussed between GI team and general surgery yesterday who recommended no EGD for at least a few weeks to ensure insufflation does not cause frank perforation. Will need PPI therapy and strict diet to allow for healing. We discussed necessary dietary changes and continuing to work toward tobacco cessation.   Ascites: Etiology unclear at this time, could be reactive in the setting of possible perforation with fluid posterior to the liver. Liver enzymes within normal limits. SAAG 0.2 (ascites not likely from portal hypertension). LDH of ascites elevated. Cytology pending. Cell count with no evidence of SBP, culture in process. Malignancy less likely however can not rule this out.  Plan / Recommendations  EGD no sooner than 3 weeks, likely couple months Diet - advance to soft diet (low fiber) IV PPI BID - transition to p.o twice daily on discharge Electrolyte repletion per attending.  Folic acid  supplementation recommended.  Advised tobacco cessation and avoidance of NSAIDs Home diet - low fiber, avoid acidity and high fat. Avoid spicy and fried foods.  Encouraged high protein diet Follow up in the office in 3-4 weeks.     LOS: 2 days   05/11/2023, 11:22 AM  Charmaine Melia, MSN, FNP-BC, AGACNP-BC [facility]

## 2023-05-11 NOTE — Progress Notes (Signed)
 PROGRESS NOTE    Jeff Anderson  FMW:[id number] DOB: [birth year] DOA: 05/09/2023 PCP: Toribio Jerel MATSU, MD   Brief Narrative:    Jeff Anderson is a 58 y.o. male with medical history significant of alcohol abuse who presents to the emergency department due to 2-week onset of vomiting and constant abdominal pain.  Pain was diffuse and was described as twisting sensation.  He states that he has not been able to keep anything down, vomiting was 2-3 episodes daily.  He endorsed about 14 pound weight loss since onset of symptoms.  Patient was admitted due to need for GI evaluation and is currently on clear liquid diet and needs further dietary advancement.  Underwent paracentesis on 2/7.  Assessment & Plan:   Principal Problem:   Gastric ulcer Active Problems:   Hypokalemia   Macrocytic anemia   GERD (gastroesophageal reflux disease)   Thrombocytosis   Underweight (BMI < 18.5)   Failure to thrive in adult   Hypoalbuminemia due to protein-calorie malnutrition (HCC)   Alcohol abuse  Assessment and Plan:   Gastric ulcer GERD vs esophagitis Continue IV Protonix  40 mg twice daily Patient will be placed n.p.o. at midnight Appreciate GI recommendations for dietary advancement Currently on clear liquid diet Plans for EGD outpatient   Hypokalemia Continue to replete and monitor   Macrocytic anemia MCV 100.8, this may be related to patient's history of alcohol abuse Noted to have low levels of folate and supplementation will be ordered   Thrombocytosis possibly reactive Platelets 413, continue monitor platelet levels   History of alcohol abuse No evidence of alcohol withdrawal at this time Continue to monitor and consider starting patient on CIWA protocol for any withdrawal symptoms   Underweight (BMI 16.46) Failure to thrive in adult Hypoalbuminemia possibly secondary to mild protein calorie malnutrition Protein supplement will be provided Dietitian will be consulted and  we shall await further recommendations   DVT prophylaxis: SCDs Code Status: Full Family Communication: None at bedside Disposition Plan:  Status is: Inpatient Remains inpatient appropriate because: Need for IV medications and inpatient evaluation.  Consultants:  GI  Procedures:  Paracentesis 2/7  Antimicrobials:  None   Subjective: Patient seen and evaluated today with no new acute complaints or concerns. No acute concerns or events noted overnight.  He is tolerating clear liquid diet and has undergone paracentesis.  Objective: Vitals:   05/10/23 2118 05/11/23 0359 05/11/23 0830 05/11/23 0840  BP: 121/85 129/81 129/86 126/80  Pulse: 60 (!) 53 62 (!) 58  Resp: 18 20 20 18   Temp: 98 F (36.7 C) 97.8 F (36.6 C) 98 F (36.7 C)   TempSrc: Oral Oral Oral   SpO2: 99% 100% 100% 100%  Weight:      Height:        Intake/Output Summary (Last 24 hours) at 05/11/2023 1338 Last data filed at 05/11/2023 1002 Gross per 24 hour  Intake 1732.48 ml  Output --  Net 1732.48 ml   Filed Weights   05/09/23 1711  Weight: 53.5 kg    Examination:  General exam: Appears calm and comfortable  Respiratory system: Clear to auscultation. Respiratory effort normal. Cardiovascular system: S1 & S2 heard, RRR.  Gastrointestinal system: Abdomen is soft Central nervous system: Alert and awake Extremities: No edema Skin: No significant lesions noted Psychiatry: Flat affect.    Data Reviewed: I have personally reviewed following labs and imaging studies  CBC: Recent Labs  Lab 05/09/23 1815 05/10/23 0246 05/11/23 0457  WBC 7.6 7.0  4.7  NEUTROABS 4.8  --   --   HGB 13.0 11.7* 10.1*  HCT 38.4* 34.8* 31.6*  MCV 100.8* 103.0* 102.9*  PLT 413* 320 285   Basic Metabolic Panel: Recent Labs  Lab 05/09/23 1815 05/10/23 0246 05/11/23 0457  NA 137 134* 132*  K 2.7* 3.1* 3.4*  CL 91* 95* 103  CO2 30 31 24   GLUCOSE 110* 87 84  BUN 16 15 11   CREATININE 0.83 0.77 0.59*  CALCIUM 9.0  8.3* 7.7*  MG  --  2.0 1.8  PHOS  --  4.2  --    GFR: Estimated Creatinine Clearance: 77.1 mL/min (A) (by C-G formula based on SCr of 0.59 mg/dL (L)). Liver Function Tests: Recent Labs  Lab 05/09/23 1815 05/10/23 0246 05/11/23 0457  AST 13* 10* 11*  ALT 9 9 8   ALKPHOS 82 70 58  BILITOT 1.1 0.9 0.7  PROT 7.5 6.5 5.3*  ALBUMIN 3.2* 2.7* 2.2*   Recent Labs  Lab 05/09/23 1815  LIPASE 35   No results for input(s): AMMONIA in the last 168 hours. Coagulation Profile: No results for input(s): INR, PROTIME in the last 168 hours. Cardiac Enzymes: No results for input(s): CKTOTAL, CKMB, CKMBINDEX, TROPONINI in the last 168 hours. BNP (last 3 results) No results for input(s): PROBNP in the last 8760 hours. HbA1C: No results for input(s): HGBA1C in the last 72 hours. CBG: Recent Labs  Lab 05/10/23 2116 05/11/23 0054 05/11/23 0402 05/11/23 0755 05/11/23 1131  GLUCAP 101* 113* 85 89 93   Lipid Profile: No results for input(s): CHOL, HDL, LDLCALC, TRIG, CHOLHDL, LDLDIRECT in the last 72 hours. Thyroid Function Tests: No results for input(s): TSH, T4TOTAL, FREET4, T3FREE, THYROIDAB in the last 72 hours. Anemia Panel: Recent Labs    05/10/23 0246  VITAMINB12 230  FOLATE 4.2*   Sepsis Labs: No results for input(s): PROCALCITON, LATICACIDVEN in the last 168 hours.  Recent Results (from the past 240 hours)  Resp panel by RT-PCR (RSV, Flu A&B, Covid) Anterior Nasal Swab     Status: None   Collection Time: 05/09/23  5:20 PM   Specimen: Anterior Nasal Swab  Result Value Ref Range Status   SARS Coronavirus 2 by RT PCR NEGATIVE NEGATIVE Final    Comment: (NOTE) SARS-CoV-2 target nucleic acids are NOT DETECTED.  The SARS-CoV-2 RNA is generally detectable in upper respiratory specimens during the acute phase of infection. The lowest concentration of SARS-CoV-2 viral copies this assay can detect is 138 copies/mL. A negative result  does not preclude SARS-Cov-2 infection and should not be used as the sole basis for treatment or other patient management decisions. A negative result may occur with  improper specimen collection/handling, submission of specimen other than nasopharyngeal swab, presence of viral mutation(s) within the areas targeted by this assay, and inadequate number of viral copies(<138 copies/mL). A negative result must be combined with clinical observations, patient history, and epidemiological information. The expected result is Negative.  Fact Sheet for Patients:  bloggercourse.com  Fact Sheet for Healthcare Providers:  seriousbroker.it  This test is no t yet approved or cleared by the [place]  FDA and  has been authorized for detection and/or diagnosis of SARS-CoV-2 by FDA under an Emergency Use Authorization (EUA). This EUA will remain  in effect (meaning this test can be used) for the duration of the COVID-19 declaration under Section 564(b)(1) of the Act, 21 U.S.C.section 360bbb-3(b)(1), unless the authorization is terminated  or revoked sooner.  Influenza A by PCR NEGATIVE NEGATIVE Final   Influenza B by PCR NEGATIVE NEGATIVE Final    Comment: (NOTE) The Xpert Xpress SARS-CoV-2/FLU/RSV plus assay is intended as an aid in the diagnosis of influenza from Nasopharyngeal swab specimens and should not be used as a sole basis for treatment. Nasal washings and aspirates are unacceptable for Xpert Xpress SARS-CoV-2/FLU/RSV testing.  Fact Sheet for Patients: bloggercourse.com  Fact Sheet for Healthcare Providers: seriousbroker.it  This test is not yet approved or cleared by the [place]  FDA and has been authorized for detection and/or diagnosis of SARS-CoV-2 by FDA under an Emergency Use Authorization (EUA). This EUA will remain in effect (meaning this test can be used) for  the duration of the COVID-19 declaration under Section 564(b)(1) of the Act, 21 U.S.C. section 360bbb-3(b)(1), unless the authorization is terminated or revoked.     Resp Syncytial Virus by PCR NEGATIVE NEGATIVE Final    Comment: (NOTE) Fact Sheet for Patients: bloggercourse.com  Fact Sheet for Healthcare Providers: seriousbroker.it  This test is not yet approved or cleared by the [place]  FDA and has been authorized for detection and/or diagnosis of SARS-CoV-2 by FDA under an Emergency Use Authorization (EUA). This EUA will remain in effect (meaning this test can be used) for the duration of the COVID-19 declaration under Section 564(b)(1) of the Act, 21 U.S.C. section 360bbb-3(b)(1), unless the authorization is terminated or revoked.  Performed at [facility], [address], [place], [place] [address]          Radiology Studies: US  Paracentesis Result Date: 05/11/2023 INDICATION: 58 y.o. Male. History of alcohol abuse. Presented to the ED with abdominal pain and vomiting found to have ascites. Admitted for failure to Thrive. Request is for therapeutic and diagnostic paracentesis EXAM: ULTRASOUND GUIDED THERAPEUTIC AND DIAGNOSTIC PARACENTESIS MEDICATIONS: Lidocaine  1%.  10 ml COMPLICATIONS: None immediate. PROCEDURE: Informed written consent was obtained from the patient after a discussion of the risks, benefits and alternatives to treatment. A timeout was performed prior to the initiation of the procedure. Initial ultrasound scanning demonstrates a small amount of ascites within the right lower abdominal quadrant. The right lower abdomen was prepped and draped in the usual sterile fashion. 1% lidocaine  was used for local anesthesia. Following this, a 19 gauge, 7-cm, Yueh catheter was introduced. An ultrasound image was saved for documentation purposes. The paracentesis was performed. The catheter was removed and a dressing was  applied. The patient tolerated the procedure well without immediate post procedural complication. FINDINGS: A total of approximately 450 ml of amber fluid was removed. Samples were sent to the laboratory as requested by the clinical team. IMPRESSION: Successful ultrasound-guided paracentesis yielding 450 milliliters of peritoneal fluid. Performed by: Delon Beagle NP Electronically Signed   By: Marcey Moan M.D.   On: 05/11/2023 09:58   CT ABDOMEN PELVIS W CONTRAST Result Date: 05/09/2023 CLINICAL DATA:  Abdominal pain. Previous abdominal surgery. Vomiting, weight loss, decreased appetite, and dehydration. EXAM: CT ABDOMEN AND PELVIS WITH CONTRAST TECHNIQUE: Multidetector CT imaging of the abdomen and pelvis was performed using the standard protocol following bolus administration of intravenous contrast. RADIATION DOSE REDUCTION: This exam was performed according to the departmental dose-optimization program which includes automated exposure control, adjustment of the mA and/or kV according to patient size and/or use of iterative reconstruction technique. CONTRAST:  OMNIPAQUE  IOHEXOL  300 MG/ML  SOLN COMPARISON:  03/22/2023 FINDINGS: Lower chest: Lung bases are clear. Lower esophageal wall is thickened and edematous. This likely represents reflux disease or  esophagitis. Hepatobiliary: No focal liver abnormality is seen. No gallstones, gallbladder wall thickening, or biliary dilatation. Pancreas: Unremarkable. No pancreatic ductal dilatation or surrounding inflammatory changes. Spleen: Normal in size without focal abnormality. Adrenals/Urinary Tract: No adrenal gland nodules. Kidneys are symmetrical. Nephrograms are homogeneous. No hydronephrosis or hydroureter. Bladder is decompressed. Stomach/Bowel: Stomach is not abnormally distended. There is evidence of edema in the pyloric and duodenal bulb region, likely inflammatory. There is a small gas and fluid collection measuring 2.1 cm diameter in the  region of the gastrohepatic ligament along the liver edge. This appears to communicate with the stomach and probably represents an ulcer. This is new since previous study. Small bowel are mostly decompressed. Decompression limits evaluation but small bowel wall appears thickened. This could indicate edema or enteritis. Scattered stool throughout the colon without abnormal distention. Appendix is not identified. Vascular/Lymphatic: Aortic atherosclerosis. No enlarged abdominal or pelvic lymph nodes. Reproductive: Prostate is unremarkable. Other: Moderate diffuse abdominal and pelvic ascites, increasing since previous study. No free air is identified. Musculoskeletal: No acute or significant osseous findings. IMPRESSION: 1. Edema around the pylorus and duodenal bulb with a small air-fluid collection along the gastrohepatic ligament likely representing a developing gastric ulcer. 2. Edema around the lower esophagus may represent reflux disease or esophagitis. 3. Mild wall thickening in the small bowel may indicate enteritis. 4. Moderate abdominal and pelvic ascites, increasing since prior study. 5. Aortic atherosclerosis. Electronically Signed   By: Elsie Gravely M.D.   On: 05/09/2023 20:46   DG Chest 2 View Result Date: 05/09/2023 CLINICAL DATA:  Weakness. EXAM: CHEST - 2 VIEW COMPARISON:  March 21, 2023. FINDINGS: The heart size and mediastinal contours are within normal limits. Both lungs are clear. Old right rib fractures are noted. IMPRESSION: No active cardiopulmonary disease. Electronically Signed   By: Lynwood Landy Raddle M.D.   On: 05/09/2023 18:21        Scheduled Meds:  cyanocobalamin   1,000 mcg Oral Daily   feeding supplement  237 mL Oral BID BM   ferrous sulfate   325 mg Oral Q breakfast   folic acid   1 mg Oral Daily   pantoprazole   40 mg Intravenous Q12H   potassium chloride   40 mEq Oral BID   sucralfate   1 g Oral TID WC & HS     LOS: 2 days    Time spent: 55 minutes    Jeff Deihl JONETTA Fairly, DO [facility]  If 7PM-7AM, please contact night-coverage [email or web address] 05/11/2023, 1:38 PM

## 2023-05-12 ENCOUNTER — Telehealth (INDEPENDENT_AMBULATORY_CARE_PROVIDER_SITE_OTHER): Payer: Self-pay | Admitting: Gastroenterology

## 2023-05-12 DIAGNOSIS — K255 Chronic or unspecified gastric ulcer with perforation: Secondary | ICD-10-CM

## 2023-05-12 LAB — COMPREHENSIVE METABOLIC PANEL
ALT: 9 U/L (ref 0–44)
AST: 13 U/L — ABNORMAL LOW (ref 15–41)
Albumin: 2.4 g/dL — ABNORMAL LOW (ref 3.5–5.0)
Alkaline Phosphatase: 63 U/L (ref 38–126)
Anion gap: 4 — ABNORMAL LOW (ref 5–15)
BUN: 8 mg/dL (ref 6–20)
CO2: 23 mmol/L (ref 22–32)
Calcium: 8 mg/dL — ABNORMAL LOW (ref 8.9–10.3)
Chloride: 107 mmol/L (ref 98–111)
Creatinine, Ser: 0.61 mg/dL (ref 0.61–1.24)
GFR, Estimated: >60 mL/min (ref >60)
Glucose, Bld: 92 mg/dL (ref 70–99)
Potassium: 4.3 mmol/L (ref 3.5–5.1)
Sodium: 134 mmol/L — ABNORMAL LOW (ref 135–145)
Total Bilirubin: 0.4 mg/dL (ref 0.0–1.2)
Total Protein: 5.8 g/dL — ABNORMAL LOW (ref 6.5–8.1)

## 2023-05-12 LAB — GLUCOSE, CAPILLARY
Glucose-Capillary: 113 mg/dL — ABNORMAL HIGH (ref 70–99)
Glucose-Capillary: 75 mg/dL (ref 70–99)
Glucose-Capillary: 78 mg/dL (ref 70–99)

## 2023-05-12 LAB — CBC
HCT: 34 % — ABNORMAL LOW (ref 39.0–52.0)
Hemoglobin: 10.9 g/dL — ABNORMAL LOW (ref 13.0–17.0)
MCH: 33.7 pg (ref 26.0–34.0)
MCHC: 32.1 g/dL (ref 30.0–36.0)
MCV: 105.3 fL — ABNORMAL HIGH (ref 80.0–100.0)
Platelets: 294 10*3/uL (ref 150–400)
RBC: 3.23 MIL/uL — ABNORMAL LOW (ref 4.22–5.81)
RDW: 15.6 % — ABNORMAL HIGH (ref 11.5–15.5)
WBC: 5.9 10*3/uL (ref 4.0–10.5)
nRBC: 0 % (ref 0.0–0.2)

## 2023-05-12 LAB — MAGNESIUM: Magnesium: 1.9 mg/dL (ref 1.7–2.4)

## 2023-05-12 MED ORDER — ENSURE ENLIVE PO LIQD: 237.0000 mL | Freq: Two times a day (BID) | ORAL | 12 refills | Status: AC

## 2023-05-12 MED ORDER — PANTOPRAZOLE SODIUM 40 MG PO TBEC: 40.0000 mg | DELAYED_RELEASE_TABLET | Freq: Two times a day (BID) | ORAL | 1 refills | Status: DC

## 2023-05-12 MED ORDER — SUCRALFATE 1 GM/10ML PO SUSP: 1.0000 g | Freq: Three times a day (TID) | ORAL | 2 refills | Status: DC

## 2023-05-12 NOTE — Plan of Care (Signed)
   Problem: Education: Goal: Knowledge of General Education information will improve Description: Including pain rating scale, medication(s)/side effects and non-pharmacologic comfort measures Outcome: Progressing   Problem: Health Behavior/Discharge Planning: Goal: Ability to manage health-related needs will improve Outcome: Progressing   Problem: Clinical Measurements: Goal: Cardiovascular complication will be avoided Outcome: Progressing

## 2023-05-12 NOTE — Discharge Summary (Signed)
 Physician Discharge Summary  Jeff Anderson FMW:[id number] DOB: [birth year] DOA: 05/09/2023  PCP: Toribio Jerel MATSU, MD  Admit date: 05/09/2023  Discharge date: 05/12/2023  Admitted From:Home  Disposition:  Home  Recommendations for Outpatient Follow-up:  Follow up with PCP in 1-2 weeks Continue on PPI twice daily as well as Carafate  every 6 hours as recommended per GI Follow-up with GI outpatient for EGD in 6-8 weeks Continue nutrition supplementation Continue other home medications as prior and avoid NSAIDs  Home Health: None  Equipment/Devices: None  Discharge Condition:Stable  CODE STATUS: Full  Diet recommendation: Heart Healthy  Brief/Interim Summary: Jeff Anderson is a 58 y.o. male with medical history significant of alcohol abuse who presents to the emergency department due to 2-week onset of vomiting and constant abdominal pain.  Pain was diffuse and was described as twisting sensation.  He states that he has not been able to keep anything down, vomiting was 2-3 episodes daily.  He endorsed about 14 pound weight loss since onset of symptoms.  Patient was admitted due to need for GI evaluation and is currently on clear liquid diet and needs further dietary advancement.  Underwent paracentesis on 2/7 with no concerning findings.  He was advanced to soft diet by GI and is now tolerating this well and appears stable for discharge.  No other acute events or concerns noted.  Discharge Diagnoses:  Principal Problem:   Gastric ulcer Active Problems:   Hypokalemia   Macrocytic anemia   GERD (gastroesophageal reflux disease)   Thrombocytosis   Underweight (BMI < 18.5)   Failure to thrive in adult   Hypoalbuminemia due to protein-calorie malnutrition (HCC)   Alcohol abuse  Principal discharge diagnosis: Contained gastric ulcer with failure to thrive.  Discharge Instructions  Discharge Instructions     Diet - low sodium heart healthy   Complete by: As directed     Increase activity slowly   Complete by: As directed       Allergies as of 05/12/2023       Reactions   Codeine Nausea And Vomiting        Medication List     STOP taking these medications    sucralfate  1 g tablet Commonly known as: Carafate  Replaced by: sucralfate  1 GM/10ML suspension       TAKE these medications    cyanocobalamin  1000 MCG tablet Take 1 tablet (1,000 mcg total) by mouth daily.   feeding supplement Liqd Take 237 mLs by mouth 2 (two) times daily between meals.   ferrous sulfate  325 (65 FE) MG tablet Take 1 tablet (325 mg total) by mouth daily with breakfast.   folic acid  1 MG tablet Commonly known as: FOLVITE  Take 1 tablet (1 mg total) by mouth daily.   mirtazapine 30 MG tablet Commonly known as: REMERON Take 60 mg by mouth at bedtime.   nicotine  21 mg/24hr patch Commonly known as: NICODERM CQ  - dosed in mg/24 hours Place 1 patch (21 mg total) onto the skin daily.   pantoprazole  40 MG tablet Commonly known as: Protonix  Take 1 tablet (40 mg total) by mouth 2 (two) times daily. What changed: when to take this   sucralfate  1 GM/10ML suspension Commonly known as: CARAFATE  Take 10 mLs (1 g total) by mouth 4 (four) times daily -  with meals and at bedtime. Replaces: sucralfate  1 g tablet   traMADol 50 MG tablet Commonly known as: ULTRAM Take 200 mg by mouth daily.  Follow-up Information     Toribio Jerel MATSU, MD. Schedule an appointment as soon as possible for a visit in 1 week(s).   Specialty: Family Medicine Contact information: [address] [place] [address] [phone]         [facility]. Go to.   Contact information: [address] [place]  [address] [phone]               Allergies  Allergen Reactions   Codeine Nausea And Vomiting    Consultations: GI   Procedures/Studies: US  Paracentesis Result Date: 05/11/2023 INDICATION: 58 y.o. Male. History of  alcohol abuse. Presented to the ED with abdominal pain and vomiting found to have ascites. Admitted for failure to Thrive. Request is for therapeutic and diagnostic paracentesis EXAM: ULTRASOUND GUIDED THERAPEUTIC AND DIAGNOSTIC PARACENTESIS MEDICATIONS: Lidocaine  1%.  10 ml COMPLICATIONS: None immediate. PROCEDURE: Informed written consent was obtained from the patient after a discussion of the risks, benefits and alternatives to treatment. A timeout was performed prior to the initiation of the procedure. Initial ultrasound scanning demonstrates a small amount of ascites within the right lower abdominal quadrant. The right lower abdomen was prepped and draped in the usual sterile fashion. 1% lidocaine  was used for local anesthesia. Following this, a 19 gauge, 7-cm, Yueh catheter was introduced. An ultrasound image was saved for documentation purposes. The paracentesis was performed. The catheter was removed and a dressing was applied. The patient tolerated the procedure well without immediate post procedural complication. FINDINGS: A total of approximately 450 ml of amber fluid was removed. Samples were sent to the laboratory as requested by the clinical team. IMPRESSION: Successful ultrasound-guided paracentesis yielding 450 milliliters of peritoneal fluid. Performed by: Delon Beagle NP Electronically Signed   By: Marcey Moan M.D.   On: 05/11/2023 09:58   CT ABDOMEN PELVIS W CONTRAST Result Date: 05/09/2023 CLINICAL DATA:  Abdominal pain. Previous abdominal surgery. Vomiting, weight loss, decreased appetite, and dehydration. EXAM: CT ABDOMEN AND PELVIS WITH CONTRAST TECHNIQUE: Multidetector CT imaging of the abdomen and pelvis was performed using the standard protocol following bolus administration of intravenous contrast. RADIATION DOSE REDUCTION: This exam was performed according to the departmental dose-optimization program which includes automated exposure control, adjustment of the mA and/or kV  according to patient size and/or use of iterative reconstruction technique. CONTRAST:  OMNIPAQUE  IOHEXOL  300 MG/ML  SOLN COMPARISON:  03/22/2023 FINDINGS: Lower chest: Lung bases are clear. Lower esophageal wall is thickened and edematous. This likely represents reflux disease or esophagitis. Hepatobiliary: No focal liver abnormality is seen. No gallstones, gallbladder wall thickening, or biliary dilatation. Pancreas: Unremarkable. No pancreatic ductal dilatation or surrounding inflammatory changes. Spleen: Normal in size without focal abnormality. Adrenals/Urinary Tract: No adrenal gland nodules. Kidneys are symmetrical. Nephrograms are homogeneous. No hydronephrosis or hydroureter. Bladder is decompressed. Stomach/Bowel: Stomach is not abnormally distended. There is evidence of edema in the pyloric and duodenal bulb region, likely inflammatory. There is a small gas and fluid collection measuring 2.1 cm diameter in the region of the gastrohepatic ligament along the liver edge. This appears to communicate with the stomach and probably represents an ulcer. This is new since previous study. Small bowel are mostly decompressed. Decompression limits evaluation but small bowel wall appears thickened. This could indicate edema or enteritis. Scattered stool throughout the colon without abnormal distention. Appendix is not identified. Vascular/Lymphatic: Aortic atherosclerosis. No enlarged abdominal or pelvic lymph nodes. Reproductive: Prostate is unremarkable. Other: Moderate diffuse abdominal and pelvic ascites, increasing since previous  study. No free air is identified. Musculoskeletal: No acute or significant osseous findings. IMPRESSION: 1. Edema around the pylorus and duodenal bulb with a small air-fluid collection along the gastrohepatic ligament likely representing a developing gastric ulcer. 2. Edema around the lower esophagus may represent reflux disease or esophagitis. 3. Mild wall thickening in the small  bowel may indicate enteritis. 4. Moderate abdominal and pelvic ascites, increasing since prior study. 5. Aortic atherosclerosis. Electronically Signed   By: Elsie Gravely M.D.   On: 05/09/2023 20:46   DG Chest 2 View Result Date: 05/09/2023 CLINICAL DATA:  Weakness. EXAM: CHEST - 2 VIEW COMPARISON:  March 21, 2023. FINDINGS: The heart size and mediastinal contours are within normal limits. Both lungs are clear. Old right rib fractures are noted. IMPRESSION: No active cardiopulmonary disease. Electronically Signed   By: Lynwood Landy Raddle M.D.   On: 05/09/2023 18:21     Discharge Exam: Vitals:   05/11/23 2023 05/12/23 0614  BP: 124/78 127/85  Pulse: 65 65  Resp: 20 20  Temp: 98.5 F (36.9 C) (!) 97.5 F (36.4 C)  SpO2: 100% 100%   Vitals:   05/11/23 0840 05/11/23 1508 05/11/23 2023 05/12/23 0614  BP: 126/80 (!) 93/58 124/78 127/85  Pulse: (!) 58 63 65 65  Resp: 18  20 20   Temp:  98.3 F (36.8 C) 98.5 F (36.9 C) (!) 97.5 F (36.4 C)  TempSrc:  Oral Oral Oral  SpO2: 100% 100% 100% 100%  Weight:      Height:        General: Pt is alert, awake, not in acute distress Cardiovascular: RRR, S1/S2 +, no rubs, no gallops Respiratory: CTA bilaterally, no wheezing, no rhonchi Abdominal: Soft, NT, ND, bowel sounds + Extremities: no edema, no cyanosis    The results of significant diagnostics from this hospitalization (including imaging, microbiology, ancillary and laboratory) are listed below for reference.     Microbiology: Recent Results (from the past 240 hours)  Resp panel by RT-PCR (RSV, Flu A&B, Covid) Anterior Nasal Swab     Status: None   Collection Time: 05/09/23  5:20 PM   Specimen: Anterior Nasal Swab  Result Value Ref Range Status   SARS Coronavirus 2 by RT PCR NEGATIVE NEGATIVE Final    Comment: (NOTE) SARS-CoV-2 target nucleic acids are NOT DETECTED.  The SARS-CoV-2 RNA is generally detectable in upper respiratory specimens during the acute phase of  infection. The lowest concentration of SARS-CoV-2 viral copies this assay can detect is 138 copies/mL. A negative result does not preclude SARS-Cov-2 infection and should not be used as the sole basis for treatment or other patient management decisions. A negative result may occur with  improper specimen collection/handling, submission of specimen other than nasopharyngeal swab, presence of viral mutation(s) within the areas targeted by this assay, and inadequate number of viral copies(<138 copies/mL). A negative result must be combined with clinical observations, patient history, and epidemiological information. The expected result is Negative.  Fact Sheet for Patients:  bloggercourse.com  Fact Sheet for Healthcare Providers:  seriousbroker.it  This test is no t yet approved or cleared by the [place]  FDA and  has been authorized for detection and/or diagnosis of SARS-CoV-2 by FDA under an Emergency Use Authorization (EUA). This EUA will remain  in effect (meaning this test can be used) for the duration of the COVID-19 declaration under Section 564(b)(1) of the Act, 21 U.S.C.section 360bbb-3(b)(1), unless the authorization is terminated  or revoked sooner.  Influenza A by PCR NEGATIVE NEGATIVE Final   Influenza B by PCR NEGATIVE NEGATIVE Final    Comment: (NOTE) The Xpert Xpress SARS-CoV-2/FLU/RSV plus assay is intended as an aid in the diagnosis of influenza from Nasopharyngeal swab specimens and should not be used as a sole basis for treatment. Nasal washings and aspirates are unacceptable for Xpert Xpress SARS-CoV-2/FLU/RSV testing.  Fact Sheet for Patients: bloggercourse.com  Fact Sheet for Healthcare Providers: seriousbroker.it  This test is not yet approved or cleared by the [place]  FDA and has been authorized for detection and/or diagnosis of SARS-CoV-2  by FDA under an Emergency Use Authorization (EUA). This EUA will remain in effect (meaning this test can be used) for the duration of the COVID-19 declaration under Section 564(b)(1) of the Act, 21 U.S.C. section 360bbb-3(b)(1), unless the authorization is terminated or revoked.     Resp Syncytial Virus by PCR NEGATIVE NEGATIVE Final    Comment: (NOTE) Fact Sheet for Patients: bloggercourse.com  Fact Sheet for Healthcare Providers: seriousbroker.it  This test is not yet approved or cleared by the [place]  FDA and has been authorized for detection and/or diagnosis of SARS-CoV-2 by FDA under an Emergency Use Authorization (EUA). This EUA will remain in effect (meaning this test can be used) for the duration of the COVID-19 declaration under Section 564(b)(1) of the Act, 21 U.S.C. section 360bbb-3(b)(1), unless the authorization is terminated or revoked.  Performed at [facility], [address], [place], [place] [address]   Body fluid culture w Gram Stain     Status: None (Preliminary result)   Collection Time: 05/11/23  8:30 AM   Specimen: Peritoneal Washings  Result Value Ref Range Status   Specimen Description PERITONEAL  Final   Special Requests NONE  Final   Gram Stain   Final    WBC PRESENT, PREDOMINANTLY MONONUCLEAR NO ORGANISMS SEEN CYTOSPIN SMEAR Performed at [facility], [address] [place], [place] [address]    Culture PENDING  Incomplete   Report Status PENDING  Incomplete     Labs: BNP (last 3 results) No results for input(s): BNP in the last 8760 hours. Basic Metabolic Panel: Recent Labs  Lab 05/09/23 1815 05/10/23 0246 05/11/23 0457 05/12/23 0536  NA 137 134* 132* 134*  K 2.7* 3.1* 3.4* 4.3  CL 91* 95* 103 107  CO2 30 31 24 23   GLUCOSE 110* 87 84 92  BUN 16 15 11 8   CREATININE 0.83 0.77 0.59* 0.61  CALCIUM 9.0 8.3* 7.7* 8.0*  MG  --  2.0 1.8 1.9  PHOS  --  4.2  --   --     Liver Function Tests: Recent Labs  Lab 05/09/23 1815 05/10/23 0246 05/11/23 0457 05/12/23 0536  AST 13* 10* 11* 13*  ALT 9 9 8 9   ALKPHOS 82 70 58 63  BILITOT 1.1 0.9 0.7 0.4  PROT 7.5 6.5 5.3* 5.8*  ALBUMIN 3.2* 2.7* 2.2* 2.4*   Recent Labs  Lab 05/09/23 1815  LIPASE 35   No results for input(s): AMMONIA in the last 168 hours. CBC: Recent Labs  Lab 05/09/23 1815 05/10/23 0246 05/11/23 0457 05/12/23 0536  WBC 7.6 7.0 4.7 5.9  NEUTROABS 4.8  --   --   --   HGB 13.0 11.7* 10.1* 10.9*  HCT 38.4* 34.8* 31.6* 34.0*  MCV 100.8* 103.0* 102.9* 105.3*  PLT 413* 320 285 294   Cardiac Enzymes: No results for input(s): CKTOTAL, CKMB, CKMBINDEX, TROPONINI in the last 168 hours.  BNP: Invalid input(s): POCBNP CBG: Recent Labs  Lab 05/11/23 1631 05/11/23 2019 05/12/23 0029 05/12/23 0434 05/12/23 0743  GLUCAP 129* 99 113* 78 75   D-Dimer No results for input(s): DDIMER in the last 72 hours. Hgb A1c No results for input(s): HGBA1C in the last 72 hours. Lipid Profile No results for input(s): CHOL, HDL, LDLCALC, TRIG, CHOLHDL, LDLDIRECT in the last 72 hours. Thyroid function studies No results for input(s): TSH, T4TOTAL, T3FREE, THYROIDAB in the last 72 hours.  Invalid input(s): FREET3 Anemia work up Recent Labs    05/10/23 0246  VITAMINB12 230  FOLATE 4.2*   Urinalysis    Component Value Date/Time   COLORURINE AMBER (A) 05/09/2023 1720   APPEARANCEUR HAZY (A) 05/09/2023 1720   LABSPEC 1.024 05/09/2023 1720   PHURINE 8.0 05/09/2023 1720   GLUCOSEU NEGATIVE 05/09/2023 1720   HGBUR NEGATIVE 05/09/2023 1720   BILIRUBINUR SMALL (A) 05/09/2023 1720   KETONESUR 5 (A) 05/09/2023 1720   PROTEINUR 100 (A) 05/09/2023 1720   UROBILINOGEN 1.0 09/22/2009 1146   NITRITE NEGATIVE 05/09/2023 1720   LEUKOCYTESUR NEGATIVE 05/09/2023 1720   Sepsis Labs Recent Labs  Lab 05/09/23 1815 05/10/23 0246 05/11/23 0457 05/12/23 0536   WBC 7.6 7.0 4.7 5.9   Microbiology Recent Results (from the past 240 hours)  Resp panel by RT-PCR (RSV, Flu A&B, Covid) Anterior Nasal Swab     Status: None   Collection Time: 05/09/23  5:20 PM   Specimen: Anterior Nasal Swab  Result Value Ref Range Status   SARS Coronavirus 2 by RT PCR NEGATIVE NEGATIVE Final    Comment: (NOTE) SARS-CoV-2 target nucleic acids are NOT DETECTED.  The SARS-CoV-2 RNA is generally detectable in upper respiratory specimens during the acute phase of infection. The lowest concentration of SARS-CoV-2 viral copies this assay can detect is 138 copies/mL. A negative result does not preclude SARS-Cov-2 infection and should not be used as the sole basis for treatment or other patient management decisions. A negative result may occur with  improper specimen collection/handling, submission of specimen other than nasopharyngeal swab, presence of viral mutation(s) within the areas targeted by this assay, and inadequate number of viral copies(<138 copies/mL). A negative result must be combined with clinical observations, patient history, and epidemiological information. The expected result is Negative.  Fact Sheet for Patients:  bloggercourse.com  Fact Sheet for Healthcare Providers:  seriousbroker.it  This test is no t yet approved or cleared by the [place]  FDA and  has been authorized for detection and/or diagnosis of SARS-CoV-2 by FDA under an Emergency Use Authorization (EUA). This EUA will remain  in effect (meaning this test can be used) for the duration of the COVID-19 declaration under Section 564(b)(1) of the Act, 21 U.S.C.section 360bbb-3(b)(1), unless the authorization is terminated  or revoked sooner.       Influenza A by PCR NEGATIVE NEGATIVE Final   Influenza B by PCR NEGATIVE NEGATIVE Final    Comment: (NOTE) The Xpert Xpress SARS-CoV-2/FLU/RSV plus assay is intended as an aid in the  diagnosis of influenza from Nasopharyngeal swab specimens and should not be used as a sole basis for treatment. Nasal washings and aspirates are unacceptable for Xpert Xpress SARS-CoV-2/FLU/RSV testing.  Fact Sheet for Patients: bloggercourse.com  Fact Sheet for Healthcare Providers: seriousbroker.it  This test is not yet approved or cleared by the [place]  FDA and has been authorized for detection and/or diagnosis of SARS-CoV-2 by FDA under an Emergency Use Authorization (EUA). This EUA will remain in  effect (meaning this test can be used) for the duration of the COVID-19 declaration under Section 564(b)(1) of the Act, 21 U.S.C. section 360bbb-3(b)(1), unless the authorization is terminated or revoked.     Resp Syncytial Virus by PCR NEGATIVE NEGATIVE Final    Comment: (NOTE) Fact Sheet for Patients: bloggercourse.com  Fact Sheet for Healthcare Providers: seriousbroker.it  This test is not yet approved or cleared by the [place]  FDA and has been authorized for detection and/or diagnosis of SARS-CoV-2 by FDA under an Emergency Use Authorization (EUA). This EUA will remain in effect (meaning this test can be used) for the duration of the COVID-19 declaration under Section 564(b)(1) of the Act, 21 U.S.C. section 360bbb-3(b)(1), unless the authorization is terminated or revoked.  Performed at [facility], [address] [place], [place] [address]   Body fluid culture w Gram Stain     Status: None (Preliminary result)   Collection Time: 05/11/23  8:30 AM   Specimen: Peritoneal Washings  Result Value Ref Range Status   Specimen Description PERITONEAL  Final   Special Requests NONE  Final   Gram Stain   Final    WBC PRESENT, PREDOMINANTLY MONONUCLEAR NO ORGANISMS SEEN CYTOSPIN SMEAR Performed at [facility], [address] [place], [place] [address]     Culture PENDING  Incomplete   Report Status PENDING  Incomplete     Time coordinating discharge: 35 minutes  SIGNED:   Adron JONETTA Fairly, DO [facility] 05/12/2023, 10:19 AM  If 7PM-7AM, please contact night-coverage [email or web address]

## 2023-05-12 NOTE — Progress Notes (Addendum)
 Jeff Anderson, M.D. Gastroenterology & Hepatology   Interval History:  No acute events overnight. Patient reports feeling well and denies having any complaints.  Tolerated soft diet.  No nausea, vomiting, fever, chills, melena or hematochezia. BUN has remained low at 8 with hemoglobin of 10.9.  Inpatient Medications:  Current Facility-Administered Medications:    acetaminophen  (TYLENOL ) tablet 650 mg, 650 mg, Oral, Q6H PRN **OR** acetaminophen  (TYLENOL ) suppository 650 mg, 650 mg, Rectal, Q6H PRN, Adefeso, Oladapo, DO   alum & mag hydroxide-simeth (MAALOX/MYLANTA) 200-200-20 MG/5ML suspension 30 mL, 30 mL, Oral, Q4H PRN, Maree, Pratik D, DO, 30 mL at 05/11/23 1957   cyanocobalamin  (VITAMIN B12) tablet 1,000 mcg, 1,000 mcg, Oral, Daily, Adefeso, Oladapo, DO, 1,000 mcg at 05/11/23 0919   feeding supplement (ENSURE ENLIVE / ENSURE PLUS) liquid 237 mL, 237 mL, Oral, BID BM, Adefeso, Oladapo, DO, 237 mL at 05/11/23 0919   ferrous sulfate  tablet 325 mg, 325 mg, Oral, Q breakfast, Adefeso, Oladapo, DO, 325 mg at 05/11/23 0919   folic acid  (FOLVITE ) tablet 1 mg, 1 mg, Oral, Daily, Adefeso, Oladapo, DO, 1 mg at 05/11/23 0919   pantoprazole  (PROTONIX ) injection 40 mg, 40 mg, Intravenous, Q12H, Ezzard Sonny RAMAN, PA-C, 40 mg at 05/11/23 2138   prochlorperazine  (COMPAZINE ) injection 10 mg, 10 mg, Intravenous, Q6H PRN, Adefeso, Oladapo, DO, 10 mg at 05/11/23 2151   sucralfate  (CARAFATE ) 1 GM/10ML suspension 1 g, 1 g, Oral, TID WC & HS, Anderson Flavors, Seena Face, MD, 1 g at 05/11/23 2139   I/O    Intake/Output Summary (Last 24 hours) at 05/12/2023 0909 Last data filed at 05/11/2023 1742 Gross per 24 hour  Intake 720 ml  Output --  Net 720 ml     Physical Exam: Temp:  [97.5 F (36.4 C)-98.5 F (36.9 C)] 97.5 F (36.4 C) (02/08 0614) Pulse Rate:  [63-65] 65 (02/08 0614) Resp:  [20] 20 (02/08 0614) BP: (93-127)/(58-85) 127/85 (02/08 0614) SpO2:  [100 %] 100 % (02/08 0614)  Temp (24hrs), Avg:98.1  F (36.7 C), Min:97.5 F (36.4 C), Max:98.5 F (36.9 C)  GENERAL: The patient is AO x3, in no acute distress. HEENT: Head is normocephalic and atraumatic. EOMI are intact. Mouth is well hydrated and without lesions. NECK: Supple. No masses LUNGS: Clear to auscultation. No presence of rhonchi/wheezing/rales. Adequate chest expansion HEART: RRR, normal s1 and s2. ABDOMEN: Soft, nontender, no guarding, no peritoneal signs, and nondistended. BS +. No masses. EXTREMITIES: Without any cyanosis, clubbing, rash, lesions or edema. NEUROLOGIC: AOx3, no focal motor deficit. SKIN: no jaundice, no rashes  Laboratory Data: CBC:     Component Value Date/Time   WBC 5.9 05/12/2023 0536   RBC 3.23 (L) 05/12/2023 0536   HGB 10.9 (L) 05/12/2023 0536   HCT 34.0 (L) 05/12/2023 0536   PLT 294 05/12/2023 0536   MCV 105.3 (H) 05/12/2023 0536   MCH 33.7 05/12/2023 0536   MCHC 32.1 05/12/2023 0536   RDW 15.6 (H) 05/12/2023 0536   LYMPHSABS 2.2 05/09/2023 1815   MONOABS 0.6 05/09/2023 1815   EOSABS 0.0 05/09/2023 1815   BASOSABS 0.0 05/09/2023 1815   COAG:  Lab Results  Component Value Date   INR 1.1 03/21/2023    BMP:     Latest Ref Rng & Units 05/12/2023    5:36 AM 05/11/2023    4:57 AM 05/10/2023    2:46 AM  BMP  Glucose 70 - 99 mg/dL 92  84  87   BUN 6 - 20 mg/dL 8  11  15   Creatinine 0.61 - 1.24 mg/dL 9.38  9.40  9.22   Sodium 135 - 145 mmol/L 134  132  134   Potassium 3.5 - 5.1 mmol/L 4.3  3.4  3.1   Chloride 98 - 111 mmol/L 107  103  95   CO2 22 - 32 mmol/L 23  24  31    Calcium 8.9 - 10.3 mg/dL 8.0  7.7  8.3     HEPATIC:     Latest Ref Rng & Units 05/12/2023    5:36 AM 05/11/2023    4:57 AM 05/10/2023    2:46 AM  Hepatic Function  Total Protein 6.5 - 8.1 g/dL 5.8  5.3  6.5   Albumin 3.5 - 5.0 g/dL 2.4  2.2  2.7   AST 15 - 41 U/L 13  11  10    ALT 0 - 44 U/L 9  8  9    Alk Phosphatase 38 - 126 U/L 63  58  70   Total Bilirubin 0.0 - 1.2 mg/dL 0.4  0.7  0.9     CARDIAC: No results  found for: CKTOTAL, CKMB, CKMBINDEX, TROPONINI    Imaging: I personally reviewed and interpreted the available labs, imaging and endoscopic files.   Assessment/Plan: 58 year old male with past medical history of alcohol and tobacco abuse, who came to the hospital after presenting vomiting, weight loss and poor appetite.  Patient was found to have hypokalemia, but otherwise his cell counts and electrolytes were within normal limits.  CT of the abdomen and pelvis with IV contrast showed presence of lower esophageal wall thickening and edema in the pyloric and duodenal bulb with small gas and fluid collection measuring 2.1 cm in the gastrohepatic ligament area, representing possible ulcer versus contained perforation.  Patient has not presented any evidence of clinical gastrointestinal bleeding during the current hospitalization and his hemoglobin has remained stable while on PPI twice daily.  Given concern of worsening his perforation with insufflation, joint decision with general surgery was made to hold off on proceeding with an EGD during the current hospitalization and to treat pharmacologically for now.  Patient has remained hemodynamically stable and without any complaints.  Has been able to tolerate diet.  Notably, he had some ascites on imaging that was drained.  Fluid analysis was negative for SBP and negative for portal hypertension.I suspect that the fluid is likely reactive from severe peptic ulcer disease and possible mild leaking from contained perforation.   EGD in 6-8 weeks Outpatient screening colonoscopy diet (low fiber) IV PPI BID - transition to p.o twice daily on discharge Sucralfate  1 g every 6 hours for 1 month Smoking and alcohol cessation Folic acid  supplementation recommended.  Avoidance of NSAIDs Home diet - low fiber, avoid acidity and high fat. Avoid spicy and fried foods.  Encouraged high protein diet Follow up in the office in 2-3 weeks.  GI service will  sign-off, please call us  back if you have any more questions.  Jeff Fortune, MD Gastroenterology and Hepatology [facility]enter[facility]

## 2023-05-12 NOTE — Telephone Encounter (Signed)
 Hi Mitzie,  Can you please schedule a follow up appointment for this patient in 2-3 weeks with Dr. Alita Irwin, [place] Lefort or [place]?  Thanks,  Samantha Cress, MD Gastroenterology and Hepatology [facility]

## 2023-05-14 LAB — BODY FLUID CULTURE W GRAM STAIN

## 2023-05-14 LAB — PH, BODY FLUID: pH, Body Fluid: 7.5

## 2023-05-15 ENCOUNTER — Other Ambulatory Visit (HOSPITAL_COMMUNITY): Payer: Self-pay

## 2023-05-15 LAB — CYTOLOGY - NON PAP

## 2023-06-04 ENCOUNTER — Ambulatory Visit: Payer: Self-pay | Admitting: Gastroenterology

## 2023-06-04 ENCOUNTER — Ambulatory Visit (INDEPENDENT_AMBULATORY_CARE_PROVIDER_SITE_OTHER): Payer: PRIVATE HEALTH INSURANCE | Admitting: Gastroenterology

## 2023-06-04 ENCOUNTER — Encounter (INDEPENDENT_AMBULATORY_CARE_PROVIDER_SITE_OTHER): Payer: Self-pay | Admitting: Gastroenterology

## 2023-06-04 VITALS — BP 112/70 | HR 84 | Temp 98.2°F | Ht 71.0 in | Wt 121.4 lb

## 2023-06-04 DIAGNOSIS — F1090 Alcohol use, unspecified, uncomplicated: Secondary | ICD-10-CM

## 2023-06-04 DIAGNOSIS — D649 Anemia, unspecified: Secondary | ICD-10-CM

## 2023-06-04 DIAGNOSIS — F17210 Nicotine dependence, cigarettes, uncomplicated: Secondary | ICD-10-CM | POA: Diagnosis not present

## 2023-06-04 DIAGNOSIS — R112 Nausea with vomiting, unspecified: Secondary | ICD-10-CM | POA: Diagnosis not present

## 2023-06-04 DIAGNOSIS — K255 Chronic or unspecified gastric ulcer with perforation: Secondary | ICD-10-CM

## 2023-06-04 MED ORDER — ONDANSETRON HCL 4 MG PO TABS
4.0000 mg | ORAL_TABLET | Freq: Three times a day (TID) | ORAL | 1 refills | Status: AC | PRN
Start: 2023-06-04 — End: ?

## 2023-06-04 MED ORDER — SUCRALFATE 1 GM/10ML PO SUSP: 1.0000 g | Freq: Three times a day (TID) | ORAL | 2 refills | Status: DC

## 2023-06-04 NOTE — Patient Instructions (Signed)
 Please continue protonix 40mg  twice daily I have sent a refill of carafate to take every 6 hours I have also sent zofran to take every 8 hours as needed for nausea Continue with protein shakes, aim for atleast two per day if your food intake is not good Please try to quit smoking as this can cause/worsen ulcers in the stomach  We will schedule you for EGD  Follow up 2 months

## 2023-06-04 NOTE — H&P (View-Only) (Signed)
 Referring Provider: Richardean Chimera, MD Primary Care Physician:  Richardean Chimera, MD Primary GI Physician: Dr. Levon Hedger   Chief Complaint  Patient presents with   Hospitalization Follow-up    Hospitalization follow up for acute gastric ulcer. Still having nausea and vomiting. Trying to take ensure twice a day.    HPI:   Jeff Anderson is a 58 y.o. male with past medical history of alcohol abuse  Patient presenting today for follow up: Hospital admission for abdominal pain, nausea/vomiting and Gastric ulcer with contained perforation   Admission in February for abdominal pain, nausea, vomiting. CT of the abdomen and pelvis with IV contrast showed presence of lower esophageal wall thickening and edema in the pyloric and duodenal bulb with small gas and fluid collection measuring 2.1 cm in the gastrohepatic ligament area, representing possible ulcer versus contained perforation.   Was decided to manage with PPI therapy and hold off on EGD until 6-8 due to concern for worsening perforation. Advised PPI BID, carafate 1g Q6h, smoking and alcohol cessation , low fiber diet, outpatient colonoscopy   Present: Patient states he is still having a lot of nausea and vomiting. Vomiting a lot of bile. He notes that he had 3 days of bad nausea and vomiting and felt better yesterday. He ate a lot yesterday, he reports he went to bed a few hours later and then kept belching and eventually had to get up and vomit.  He does feel that abdominal pain is better. He reports he has a lot of hiccupping and also reports belching with a sulfur like taste when he does. Not currently on nausea medications. He is taking protonix 40mg  BID but ran out of carafate, He is unsure how long he has been off of this.    Has lost about 40 pounds since onset of symptoms in december. He had a fall where he broke some ribs and then developed pneumonia and then notes he began having GI symptoms thereafter.   He is still smoking  1 PPD. He denies NSAID use, no topical NSAIDs. He has not been drinking alcohol since December, previously drinking heavily.   He is trying to do a protein shake but notes he does maybe one every 2-3 days. Doing some chocolate milk and candy in between as he can tolerate these better. Patient reports dizziness as well. Denies rectal bleeding and melena.   Last Colonoscopy:never  Last Endoscopy: never  Recommendations:    Filed Weights   06/04/23 1429  Weight: 121 lb 6.4 oz (55.1 kg)     Past Medical History:  Diagnosis Date   Iron deficiency anemia     Past Surgical History:  Procedure Laterality Date   ANKLE SURGERY Left    ELBOW SURGERY      Current Outpatient Medications  Medication Sig Dispense Refill   feeding supplement (ENSURE ENLIVE / ENSURE PLUS) LIQD Take 237 mLs by mouth 2 (two) times daily between meals. 237 mL 12   ferrous sulfate 325 (65 FE) MG tablet Take 1 tablet (325 mg total) by mouth daily with breakfast. 120 tablet 0   folic acid (FOLVITE) 1 MG tablet Take 1 tablet (1 mg total) by mouth daily. 30 tablet 3   nicotine (NICODERM CQ - DOSED IN MG/24 HOURS) 21 mg/24hr patch Place 1 patch (21 mg total) onto the skin daily. 28 patch 0   pantoprazole (PROTONIX) 40 MG tablet Take 1 tablet (40 mg total) by mouth 2 (two) times daily. 60  tablet 1   sucralfate (CARAFATE) 1 GM/10ML suspension Take 10 mLs (1 g total) by mouth 4 (four) times daily -  with meals and at bedtime. 420 mL 2   traMADol (ULTRAM) 50 MG tablet Take 200 mg by mouth daily.     cyanocobalamin 1000 MCG tablet Take 1 tablet (1,000 mcg total) by mouth daily. (Patient not taking: Reported on 06/04/2023) 120 tablet 0   mirtazapine (REMERON) 30 MG tablet Take 60 mg by mouth at bedtime.     No current facility-administered medications for this visit.    Allergies as of 06/04/2023 - Review Complete 06/04/2023  Allergen Reaction Noted   Codeine Nausea And Vomiting 07/15/2014    Social History    Socioeconomic History   Marital status: Married    Spouse name: Not on file   Number of children: Not on file   Years of education: Not on file   Highest education level: Not on file  Occupational History   Not on file  Tobacco Use   Smoking status: Every Day    Current packs/day: 1.50    Average packs/day: 1.5 packs/day for 30.0 years (45.0 ttl pk-yrs)    Types: Cigarettes   Smokeless tobacco: Never  Vaping Use   Vaping status: Never Used  Substance and Sexual Activity   Alcohol use: Yes    Alcohol/week: 3.0 standard drinks of alcohol    Types: 3 Cans of beer per week   Drug use: Not Currently    Comment: past cocaine use - last used 20 years ago, last used marijuana in 1986   Sexual activity: Yes    Birth control/protection: None  Other Topics Concern   Not on file  Social History Narrative   Not on file   Social Drivers of Health   Financial Resource Strain: Not on file  Food Insecurity: No Food Insecurity (05/10/2023)   Hunger Vital Sign    Worried About Running Out of Food in the Last Year: Never true    Ran Out of Food in the Last Year: Never true  Transportation Needs: No Transportation Needs (05/10/2023)   PRAPARE - Administrator, Civil Service (Medical): No    Lack of Transportation (Non-Medical): No  Physical Activity: Not on file  Stress: Not on file  Social Connections: Not on file    Review of systems General: negative for malaise, night sweats, fever, chills + weight loss Neck: Negative for lumps, goiter, pain and significant neck swelling Resp: Negative for cough, wheezing, dyspnea at rest CV: Negative for chest pain, leg swelling, palpitations, orthopnea GI: denies melena, hematochezia, diarrhea, constipation, dysphagia, odyonophagia, early satiety    + Nausea + vomiting + weight loss MSK: Negative for joint pain or swelling, back pain, and muscle pain. Derm: Negative for itching or rash Psych: Denies depression, anxiety, memory loss,  confusion. No homicidal or suicidal ideation.  Heme: Negative for prolonged bleeding, bruising easily, and swollen nodes. Endocrine: Negative for cold or heat intolerance, polyuria, polydipsia and goiter. Neuro: negative for tremor, gait imbalance, syncope and seizures. The remainder of the review of systems is noncontributory.  Physical Exam: BP 112/70   Pulse 84   Temp 98.2 F (36.8 C)   Ht 5\' 11"  (1.803 m)   Wt 121 lb 6.4 oz (55.1 kg)   BMI 16.93 kg/m  General:   Alert and oriented. No distress noted. Pleasant and cooperative.  Head:  Normocephalic and atraumatic. Eyes:  Conjuctiva clear without scleral icterus. Mouth:  Oral mucosa pink and moist. Good dentition. No lesions. Heart: Normal rate and rhythm, s1 and s2 heart sounds present.  Lungs: Clear lung sounds in all lobes. Respirations equal and unlabored. Abdomen:  +BS, soft, non-tender and non-distended. No rebound or guarding. No HSM or masses noted. Neurologic:  Alert and  oriented x4 Psych:  Alert and cooperative. Normal mood and affect.  Invalid input(s): "6 MONTHS"   ASSESSMENT: Jeff Anderson is a 58 y.o. male presenting today for hospital follow up of hospital admission for abdominal pain, nausea/vomiting and Gastric ulcer with contained perforation   Recent hospital admission as above after presenting for abdominal pain, nausea, vomiting, CT done at that time was concerning for gastric ulcer with possible contained perforation.  It was recommended EGD be scheduled for 4 to 6 weeks out and patient be managed with medication via PPI twice daily and Carafate 1 g every 6 hours.  Patient notes that abdominal pain has improved but he continues to have nausea and vomiting.  He is not currently on any antiemetics.  Taking PPI twice daily but did recently run Carafate.  He continues to smoke about 1 pack/day though reports he is not using alcohol since December.  I encouraged him to continue attempting smoking cessation and  to continue avoid alcohol.  Will continue with PPI twice daily and can continue the short course of Carafate as well.  Will get him scheduled for EGD for mid to late March.  He can take Zofran 4 mg every 8 hours as needed for nausea vomiting.  Of note patient does report some dizziness today.  He denies any rectal eating or melena.  Will update CBC to ensure no ongoing anemia contributing to his symptoms.  Notably patient has never had colorectal cancer screening via colonoscopy.  I would recommend that he undergo this in the near future though given his ongoing upper GI symptoms would be hesitant to try and prep him at this time.  Will discuss scheduling colonoscopy at follow-up visit.   PLAN:  -continue PPI BID -Continue Carafate 1g Q6h -EGD mid to end of March  -discuss colonoscopy at follow up visit -zofran 4mg  Q8h PRN  -CBC   All questions were answered, patient verbalized understanding and is in agreement with plan as outlined above.   Follow Up: 2months   Frimet Durfee L. Jeanmarie Hubert, MSN, APRN, AGNP-C Adult-Gerontology Nurse Practitioner [facility]  I have reviewed the note and agree with the APP's assessment as described in this progress note  New onset nausea/vomiting, maybe concerning for stricturing disease  after ulcer has healed.  Katrinka Blazing, MD Gastroenterology and Hepatology [facility] Gastroenterology

## 2023-06-04 NOTE — Progress Notes (Unsigned)
 Referring Provider: Richardean Chimera, MD Primary Care Physician:  Richardean Chimera, MD Primary GI Physician: Dr. Levon Hedger   Chief Complaint  Patient presents with   Hospitalization Follow-up    Hospitalization follow up for acute gastric ulcer. Still having nausea and vomiting. Trying to take ensure twice a day.    HPI:   Jeff Anderson is a 58 y.o. male with past medical history of alcohol abuse  Patient presenting today for follow up: Hospital admission for gastric ulcer   Admission in February for abdominal pain, nausea, vomiting. CT of the abdomen and pelvis with IV contrast showed presence of lower esophageal wall thickening and edema in the pyloric and duodenal bulb with small gas and fluid collection measuring 2.1 cm in the gastrohepatic ligament area, representing possible ulcer versus contained perforation.   Was decided to manage with PPI therapy and hold off on EGD until 6-8 due to concern for worsening perforation. Advised PP BID, carafate 1g Q6h, smoking and alcohol cessation , low fiber diet, outpatient colonoscopy   Present: Patient states he is still having a lot of nausea and vomiting. Vomiting a lot of bile. He notes that he had 3 days of bad nausea and vomiting and felt better yesterday. He ate a lot yesterday, he reports he went to bed a few hours later and then kept belching and eventually had to get up and vomit.  He does feel that abdominal pain is better. He reports he has a lot of hiccupping and also reports belching with a sulfur like taste when he does. Not currently on nausea medications. He is taking protonix 40mg  BID but ran out of carafate, He is unsure when this was   Patient states symptoms have been ongoing since December. Has lost about 40 pounds since onset. He had a fall where he broke some ribs and then developed pneumonia and then notes he began having GI symptoms.   He is still smoking 1 PPD. He denies NSAID use, no topical NSAIDs. He has not  been drinking alcohol since December, previously drinking heavily.   He is trying to do a protein shake but notes he does maybe one every 2-3 days. Doing some chocolate milk and candy in between as he can tolerate these better. Patient reports dizziness as well. Denies rectal bleeding and melena.   Last Colonoscopy:never  Last Endoscopy: never  Recommendations:    Filed Weights   06/04/23 1429  Weight: 121 lb 6.4 oz (55.1 kg)     Past Medical History:  Diagnosis Date   Iron deficiency anemia     Past Surgical History:  Procedure Laterality Date   ANKLE SURGERY Left    ELBOW SURGERY      Current Outpatient Medications  Medication Sig Dispense Refill   feeding supplement (ENSURE ENLIVE / ENSURE PLUS) LIQD Take 237 mLs by mouth 2 (two) times daily between meals. 237 mL 12   ferrous sulfate 325 (65 FE) MG tablet Take 1 tablet (325 mg total) by mouth daily with breakfast. 120 tablet 0   folic acid (FOLVITE) 1 MG tablet Take 1 tablet (1 mg total) by mouth daily. 30 tablet 3   nicotine (NICODERM CQ - DOSED IN MG/24 HOURS) 21 mg/24hr patch Place 1 patch (21 mg total) onto the skin daily. 28 patch 0   pantoprazole (PROTONIX) 40 MG tablet Take 1 tablet (40 mg total) by mouth 2 (two) times daily. 60 tablet 1   sucralfate (CARAFATE) 1 GM/10ML suspension Take  10 mLs (1 g total) by mouth 4 (four) times daily -  with meals and at bedtime. 420 mL 2   traMADol (ULTRAM) 50 MG tablet Take 200 mg by mouth daily.     cyanocobalamin 1000 MCG tablet Take 1 tablet (1,000 mcg total) by mouth daily. (Patient not taking: Reported on 06/04/2023) 120 tablet 0   mirtazapine (REMERON) 30 MG tablet Take 60 mg by mouth at bedtime.     No current facility-administered medications for this visit.    Allergies as of 06/04/2023 - Review Complete 06/04/2023  Allergen Reaction Noted   Codeine Nausea And Vomiting 07/15/2014    Social History   Socioeconomic History   Marital status: Married    Spouse name:  Not on file   Number of children: Not on file   Years of education: Not on file   Highest education level: Not on file  Occupational History   Not on file  Tobacco Use   Smoking status: Every Day    Current packs/day: 1.50    Average packs/day: 1.5 packs/day for 30.0 years (45.0 ttl pk-yrs)    Types: Cigarettes   Smokeless tobacco: Never  Vaping Use   Vaping status: Never Used  Substance and Sexual Activity   Alcohol use: Yes    Alcohol/week: 3.0 standard drinks of alcohol    Types: 3 Cans of beer per week   Drug use: Not Currently    Comment: past cocaine use - last used 20 years ago, last used marijuana in 1986   Sexual activity: Yes    Birth control/protection: None  Other Topics Concern   Not on file  Social History Narrative   Not on file   Social Drivers of Health   Financial Resource Strain: Not on file  Food Insecurity: No Food Insecurity (05/10/2023)   Hunger Vital Sign    Worried About Running Out of Food in the Last Year: Never true    Ran Out of Food in the Last Year: Never true  Transportation Needs: No Transportation Needs (05/10/2023)   PRAPARE - Administrator, Civil Service (Medical): No    Lack of Transportation (Non-Medical): No  Physical Activity: Not on file  Stress: Not on file  Social Connections: Not on file    Review of systems General: negative for malaise, night sweats, fever, chills, weight los Neck: Negative for lumps, goiter, pain and significant neck swelling Resp: Negative for cough, wheezing, dyspnea at rest CV: Negative for chest pain, leg swelling, palpitations, orthopnea GI: denies melena, hematochezia, nausea, vomiting, diarrhea, constipation, dysphagia, odyonophagia, early satiety or unintentional weight loss.  MSK: Negative for joint pain or swelling, back pain, and muscle pain. Derm: Negative for itching or rash Psych: Denies depression, anxiety, memory loss, confusion. No homicidal or suicidal ideation.  Heme:  Negative for prolonged bleeding, bruising easily, and swollen nodes. Endocrine: Negative for cold or heat intolerance, polyuria, polydipsia and goiter. Neuro: negative for tremor, gait imbalance, syncope and seizures. The remainder of the review of systems is noncontributory.  Physical Exam: BP 112/70   Pulse 84   Temp 98.2 F (36.8 C)   Ht 5\' 11"  (1.803 m)   Wt 121 lb 6.4 oz (55.1 kg)   BMI 16.93 kg/m  General:   Alert and oriented. No distress noted. Pleasant and cooperative.  Head:  Normocephalic and atraumatic. Eyes:  Conjuctiva clear without scleral icterus. Mouth:  Oral mucosa pink and moist. Good dentition. No lesions. Heart: Normal rate and rhythm,  s1 and s2 heart sounds present.  Lungs: Clear lung sounds in all lobes. Respirations equal and unlabored. Abdomen:  +BS, soft, non-tender and non-distended. No rebound or guarding. No HSM or masses noted. Derm: No palmar erythema or jaundice Msk:  Symmetrical without gross deformities. Normal posture. Extremities:  Without edema. Neurologic:  Alert and  oriented x4 Psych:  Alert and cooperative. Normal mood and affect.  Invalid input(s): "6 MONTHS"   ASSESSMENT: Jeff Anderson is a 58 y.o. male presenting today    PLAN:  -continue PPI BID -Continue Carafate 1g Q6h -EGD mid to end of March  -discuss colonoscopy at follow up visit -zofran 4mg  Q8h PRN  -CBC    Follow Up: 2months   Jeff Anderson L. Jeanmarie Hubert, MSN, APRN, AGNP-C Adult-Gerontology Nurse Practitioner [facility]

## 2023-06-05 LAB — CBC
Hematocrit: 41.8 % (ref 37.5–51.0)
Hemoglobin: 13.8 g/dL (ref 13.0–17.7)
MCH: 32.4 pg (ref 26.6–33.0)
MCHC: 33 g/dL (ref 31.5–35.7)
MCV: 98 fL — ABNORMAL HIGH (ref 79–97)
Platelets: 411 10*3/uL (ref 150–450)
RBC: 4.26 x10E6/uL (ref 4.14–5.80)
RDW: 13.4 % (ref 11.6–15.4)
WBC: 6.7 10*3/uL (ref 3.4–10.8)

## 2023-06-13 ENCOUNTER — Ambulatory Visit (HOSPITAL_COMMUNITY): Payer: PRIVATE HEALTH INSURANCE | Admitting: Anesthesiology

## 2023-06-13 ENCOUNTER — Ambulatory Visit (HOSPITAL_COMMUNITY)
Admission: RE | Admit: 2023-06-13 | Discharge: 2023-06-13 | Disposition: A | Discharge status: Home / Self Care | Payer: PRIVATE HEALTH INSURANCE | Source: Ambulatory Visit | Attending: Gastroenterology | Admitting: Gastroenterology

## 2023-06-13 ENCOUNTER — Other Ambulatory Visit (INDEPENDENT_AMBULATORY_CARE_PROVIDER_SITE_OTHER): Payer: Self-pay

## 2023-06-13 ENCOUNTER — Encounter (HOSPITAL_COMMUNITY)
Admission: RE | Disposition: A | Discharge status: Home / Self Care | Payer: Self-pay | Source: Ambulatory Visit | Attending: Gastroenterology

## 2023-06-13 ENCOUNTER — Other Ambulatory Visit: Payer: Self-pay

## 2023-06-13 ENCOUNTER — Encounter (HOSPITAL_COMMUNITY): Payer: Self-pay | Admitting: Gastroenterology

## 2023-06-13 ENCOUNTER — Ambulatory Visit (HOSPITAL_BASED_OUTPATIENT_CLINIC_OR_DEPARTMENT_OTHER): Payer: PRIVATE HEALTH INSURANCE | Admitting: Anesthesiology

## 2023-06-13 ENCOUNTER — Telehealth (INDEPENDENT_AMBULATORY_CARE_PROVIDER_SITE_OTHER): Payer: Self-pay

## 2023-06-13 DIAGNOSIS — K259 Gastric ulcer, unspecified as acute or chronic, without hemorrhage or perforation: Secondary | ICD-10-CM

## 2023-06-13 DIAGNOSIS — F17210 Nicotine dependence, cigarettes, uncomplicated: Secondary | ICD-10-CM | POA: Insufficient documentation

## 2023-06-13 DIAGNOSIS — K2100 Gastro-esophageal reflux disease with esophagitis, without bleeding: Secondary | ICD-10-CM | POA: Diagnosis not present

## 2023-06-13 DIAGNOSIS — X58XXXA Exposure to other specified factors, initial encounter: Secondary | ICD-10-CM | POA: Insufficient documentation

## 2023-06-13 DIAGNOSIS — K209 Esophagitis, unspecified without bleeding: Secondary | ICD-10-CM

## 2023-06-13 DIAGNOSIS — K449 Diaphragmatic hernia without obstruction or gangrene: Secondary | ICD-10-CM | POA: Diagnosis not present

## 2023-06-13 DIAGNOSIS — K3189 Other diseases of stomach and duodenum: Secondary | ICD-10-CM | POA: Diagnosis not present

## 2023-06-13 DIAGNOSIS — K255 Chronic or unspecified gastric ulcer with perforation: Secondary | ICD-10-CM

## 2023-06-13 DIAGNOSIS — T182XXA Foreign body in stomach, initial encounter: Secondary | ICD-10-CM

## 2023-06-13 DIAGNOSIS — K311 Adult hypertrophic pyloric stenosis: Secondary | ICD-10-CM

## 2023-06-13 HISTORY — PX: ESOPHAGOGASTRODUODENOSCOPY (EGD) WITH PROPOFOL: SHX5813

## 2023-06-13 SURGERY — ESOPHAGOGASTRODUODENOSCOPY (EGD) WITH PROPOFOL
Anesthesia: General

## 2023-06-13 MED ORDER — SUCRALFATE 1 GM/10ML PO SUSP: 1.0000 g | Freq: Three times a day (TID) | ORAL | 2 refills | Status: AC

## 2023-06-13 MED ORDER — LIDOCAINE HCL (PF) 2 % IJ SOLN
INTRAMUSCULAR | Status: DC | PRN
  Administered 2023-06-13: 50 mg via INTRADERMAL

## 2023-06-13 MED ORDER — PROPOFOL 500 MG/50ML IV EMUL
INTRAVENOUS | Status: DC | PRN
  Administered 2023-06-13: 150 ug/kg/min via INTRAVENOUS

## 2023-06-13 MED ORDER — PROPOFOL 10 MG/ML IV BOLUS
INTRAVENOUS | Status: DC | PRN
  Administered 2023-06-13: 100 mg via INTRAVENOUS

## 2023-06-13 MED ORDER — OMEPRAZOLE-SODIUM BICARBONATE 40-1100 MG PO CAPS: 1.0000 | ORAL_CAPSULE | Freq: Every day | ORAL | 3 refills | Status: AC

## 2023-06-13 MED ORDER — PHENYLEPHRINE 80 MCG/ML (10ML) SYRINGE FOR IV PUSH (FOR BLOOD PRESSURE SUPPORT)
PREFILLED_SYRINGE | INTRAVENOUS | Status: DC | PRN
  Administered 2023-06-13 (×2): 160 ug via INTRAVENOUS

## 2023-06-13 MED ORDER — LACTATED RINGERS IV SOLN: INTRAVENOUS | Status: DC

## 2023-06-13 NOTE — Discharge Instructions (Signed)
 You are being discharged to home.  Resume your previous diet.  We are waiting for your pathology results.  Your physician has recommended a repeat upper endoscopy after studies are complete for surveillance.  Continue sucralfate 1 every 6 hours. Stop pantoprazole, start Zegerid twice a day - open capsule and swallow granules. Repeat upper endoscopy after studies are complete for surveillance.  Check fasting gastrin - first thing in the morning. Smoking Cessation is imperative.

## 2023-06-13 NOTE — Telephone Encounter (Signed)
 Dolores Frame, MD  Meredith Leeds, CMA Hi Bettyjean Stefanski,  Can you please send an order for serum gastrin? Needs to perform this first thing in the morning  Thanks,  Katrinka Blazing, MD Gastroenterology and Hepatology [facility] Gastroenterology

## 2023-06-13 NOTE — Op Note (Signed)
 [facility] Patient Name: Jeff Anderson Procedure Date: 06/13/2023 12:52 PM MRN: [id number] Date of Birth: 1965-08-09 Attending MD: Katrinka Blazing , , [id number] CSN: [id number] Age: 58 Admit Type: Outpatient Procedure:                Upper GI endoscopy Indications:              Follow-up of acute peptic ulcer with perforation Providers:                Katrinka Blazing, Angelica Ran, Cyril Mourning,                            Technician Referring MD:              Medicines:                Monitored Anesthesia Care Complications:            No immediate complications. Estimated Blood Loss:     Estimated blood loss: none. Procedure:                Pre-Anesthesia Assessment:                           - Prior to the procedure, a History and Physical                            was performed, and patient medications, allergies                            and sensitivities were reviewed. The patient's                            tolerance of previous anesthesia was reviewed.                           - The risks and benefits of the procedure and the                            sedation options and risks were discussed with the                            patient. All questions were answered and informed                            consent was obtained.                           - ASA Grade Assessment: II - A patient with mild                            systemic disease.                           After obtaining informed consent, the endoscope was                            passed under direct vision. Throughout  the                            procedure, the patient's blood pressure, pulse, and                            oxygen saturations were monitored continuously. The                            GIF-H190 ([id number]) scope was introduced through the                            mouth, and advanced to the antrum of the stomach.                            The GIF-[id number] ([id number]) scope  was introduced                            through the mouth, and advanced to the second part                            of duodenum. The upper GI endoscopy was technically                            difficult and complex due to narrowing. Successful                            completion of the procedure was aided by changing                            the scope to ultraslim. The patient tolerated the                            procedure well. Scope In: 1:06:55 PM Scope Out: 1:23:56 PM Total Procedure Duration: 0 hours 17 minutes 1 second  Findings:      LA Grade D (one or more mucosal breaks involving at least 75% of       esophageal circumference) esophagitis with no bleeding was found 28 to       41 cm from the incisors.      A 3 cm hiatal hernia was present.      One partially obstructing non-bleeding cratered gastric ulcer of       significant severity with a clean ulcer base (Forrest Class III) was       found at the pylorus. The lesion was 10 mm in largest dimension. There       is no evidence of perforation. Biopsies were taken from the ulcer edge       with a cold forceps for histology. Biopsies were also taken from the       rest of the stomach with a cold forceps for Helicobacter pylori testing.      A medium amount of food (residue) was found in the gastric body. Removal       was accomplished with a Roth net.      A benign-appearing, intrinsic severe stenosis was found at the pylorus.  This was traversed after switching the scope to an ultraslim scope.      The examined duodenum was normal. Impression:               - LA Grade D reflux esophagitis with no bleeding.                           - 3 cm hiatal hernia.                           - Partially obstructing non-bleeding gastric ulcer                            with a clean ulcer base (Forrest Class III). There                            is no evidence of perforation. Biopsied.                           - A medium  amount of food (residue) in the stomach.                            Removal was successful.                           - Gastric stenosis was found at the pylorus.                           - Normal examined duodenum. Moderate Sedation:      Per Anesthesia Care Recommendation:           - Discharge patient to home (ambulatory).                           - Resume previous diet.                           - Await pathology results.                           - Continue sucralfate 1 every 6 hours.                           - Stop pantoprazole, start Zegerid twice a day -                            open capsule and swallow granules.                           - Repeat upper endoscopy after studies are complete                            for surveillance.                           - Check fasting gastrin - first thing in the  morning.                           - Smoking Cessation is imperative. Procedure Code(s):        --- Professional ---                           [phone], Esophagogastroduodenoscopy, flexible,                            transoral; with removal of foreign body(s)                           43239, Esophagogastroduodenoscopy, flexible,                            transoral; with biopsy, single or multiple Diagnosis Code(s):        --- Professional ---                           K21.00, Gastro-esophageal reflux disease with                            esophagitis, without bleeding                           K44.9, Diaphragmatic hernia without obstruction or                            gangrene                           K25.9, Gastric ulcer, unspecified as acute or                            chronic, without hemorrhage or perforation                           T18.2XXA, Foreign body in stomach, initial encounter                           K31.1, Adult hypertrophic pyloric stenosis                           K27.1, Acute peptic ulcer, site unspecified, with                             perforation CPT copyright 2022 American [employer] Association. All rights reserved. The codes documented in this report are preliminary and upon coder review may  be revised to meet current compliance requirements. Katrinka Blazing, MD Katrinka Blazing,  06/13/2023 1:36:41 PM This report has been signed electronically. Number of Addenda: 0

## 2023-06-13 NOTE — Transfer of Care (Signed)
 Immediate Anesthesia Transfer of Care Note  Patient: Jeff Anderson  Procedure(s) Performed: ESOPHAGOGASTRODUODENOSCOPY (EGD) WITH PROPOFOL  Patient Location: Endoscopy Unit  Anesthesia Type:General  Level of Consciousness: drowsy  Airway & Oxygen Therapy: Patient Spontanous Breathing  Post-op Assessment: Report given to RN and Post -op Vital signs reviewed and stable  Post vital signs: Reviewed and stable  Last Vitals:  Vitals Value Taken Time  BP    Temp    Pulse    Resp    SpO2      Last Pain:  Vitals:   06/13/23 1302  TempSrc:   PainSc: 0-No pain      Patients Stated Pain Goal: 5 (06/13/23 1124)  Complications: No notable events documented.

## 2023-06-13 NOTE — Anesthesia Preprocedure Evaluation (Signed)
 Anesthesia Evaluation  Patient identified by MRN, date of birth, ID band Patient awake    Reviewed: Allergy & Precautions, H&P , NPO status , Patient's Chart, lab work & pertinent test results, reviewed documented beta blocker date and time   Airway Mallampati: II  TM Distance: >3 FB Neck ROM: full    Dental no notable dental hx.    Pulmonary neg pulmonary ROS, pneumonia, Current Smoker   Pulmonary exam normal breath sounds clear to auscultation       Cardiovascular Exercise Tolerance: Good hypertension, negative cardio ROS  Rhythm:regular Rate:Normal     Neuro/Psych negative neurological ROS  negative psych ROS   GI/Hepatic negative GI ROS, Neg liver ROS, PUD,GERD  ,,  Endo/Other  negative endocrine ROS    Renal/GU negative Renal ROS  negative genitourinary   Musculoskeletal   Abdominal   Peds  Hematology negative hematology ROS (+) Blood dyscrasia, anemia   Anesthesia Other Findings   Reproductive/Obstetrics negative OB ROS                             Anesthesia Physical Anesthesia Plan  ASA: 2  Anesthesia Plan: General   Post-op Pain Management:    Induction:   PONV Risk Score and Plan: Propofol infusion  Airway Management Planned:   Additional Equipment:   Intra-op Plan:   Post-operative Plan:   Informed Consent: I have reviewed the patients History and Physical, chart, labs and discussed the procedure including the risks, benefits and alternatives for the proposed anesthesia with the patient or authorized representative who has indicated his/her understanding and acceptance.     Dental Advisory Given  Plan Discussed with: CRNA  Anesthesia Plan Comments:        Anesthesia Quick Evaluation

## 2023-06-13 NOTE — Telephone Encounter (Signed)
 Chronic gastric ulcer with perforation.  Thanks!

## 2023-06-13 NOTE — Anesthesia Procedure Notes (Signed)
 Date/Time: 06/13/2023 1:01 PM  Performed by: Julian Reil, CRNAPre-anesthesia Checklist: Patient identified, Emergency Drugs available, Suction available and Patient being monitored Patient Re-evaluated:Patient Re-evaluated prior to induction Oxygen Delivery Method: Nasal cannula Induction Type: IV induction Placement Confirmation: positive ETCO2

## 2023-06-13 NOTE — Telephone Encounter (Signed)
 Tried calling mail box is full. I have placed the orders at [facility]. This test needs to be done early in the am.

## 2023-06-13 NOTE — Interval H&P Note (Signed)
 History and Physical Interval Note:  06/13/2023 11:50 AM  Jeff Anderson  has presented today for surgery, with the diagnosis of GASTRIC ULCER.  The various methods of treatment have been discussed with the patient and family. After consideration of risks, benefits and other options for treatment, the patient has consented to  Procedure(s) with comments: ESOPHAGOGASTRODUODENOSCOPY (EGD) WITH PROPOFOL (N/A) - 1:15PM;ASA 2 as a surgical intervention.  The patient's history has been reviewed, patient examined, no change in status, stable for surgery.  I have reviewed the patient's chart and labs.  Questions were answered to the patient's satisfaction.     Katrinka Blazing Mayorga

## 2023-06-14 ENCOUNTER — Other Ambulatory Visit (HOSPITAL_COMMUNITY)
Admission: RE | Admit: 2023-06-14 | Discharge: 2023-06-14 | Disposition: A | Discharge status: Home / Self Care | Payer: PRIVATE HEALTH INSURANCE | Source: Ambulatory Visit | Attending: Gastroenterology | Admitting: Gastroenterology

## 2023-06-14 ENCOUNTER — Encounter (HOSPITAL_COMMUNITY): Payer: Self-pay | Admitting: Gastroenterology

## 2023-06-14 DIAGNOSIS — K255 Chronic or unspecified gastric ulcer with perforation: Secondary | ICD-10-CM | POA: Insufficient documentation

## 2023-06-15 LAB — GASTRIN: Gastrin: 69 pg/mL (ref 0–115)

## 2023-06-15 LAB — SURGICAL PATHOLOGY

## 2023-06-15 NOTE — Telephone Encounter (Signed)
 Pt notified. Test in process

## 2023-06-15 NOTE — Anesthesia Postprocedure Evaluation (Signed)
 Anesthesia Post Note  Patient: Jeff Anderson  Procedure(s) Performed: ESOPHAGOGASTRODUODENOSCOPY (EGD) WITH PROPOFOL  Patient location during evaluation: Phase II Anesthesia Type: General Level of consciousness: awake Pain management: pain level controlled Vital Signs Assessment: post-procedure vital signs reviewed and stable Respiratory status: spontaneous breathing and respiratory function stable Cardiovascular status: blood pressure returned to baseline and stable Postop Assessment: no headache and no apparent nausea or vomiting Anesthetic complications: no Comments: Late entry   No notable events documented.   Last Vitals:  Vitals:   06/13/23 1124 06/13/23 1329  BP: (!) 156/79 108/66  Pulse: 69 (!) 58  Resp: 16 20  Temp: 36.5 C 37.1 C  SpO2: 100% 99%    Last Pain:  Vitals:   06/13/23 1329  TempSrc: Oral  PainSc: 0-No pain                 Windell Norfolk

## 2023-06-25 ENCOUNTER — Encounter (INDEPENDENT_AMBULATORY_CARE_PROVIDER_SITE_OTHER): Payer: Self-pay | Admitting: *Deleted

## 2023-06-26 ENCOUNTER — Telehealth: Payer: Self-pay

## 2023-06-26 DIAGNOSIS — F17210 Nicotine dependence, cigarettes, uncomplicated: Secondary | ICD-10-CM

## 2023-06-26 DIAGNOSIS — Z87891 Personal history of nicotine dependence: Secondary | ICD-10-CM

## 2023-06-26 DIAGNOSIS — Z122 Encounter for screening for malignant neoplasm of respiratory organs: Secondary | ICD-10-CM

## 2023-06-26 NOTE — Telephone Encounter (Signed)
 Lung Cancer Screening Narrative/Criteria Questionnaire (Cigarette Smokers Only- No Cigars/Pipes/vapes)   Jeff Anderson   SDMV:07/10/2023 at 10:00 am with Vernona Rieger        22-Apr-1965   LDCT: 07/11/2023 at 10:30 am at [facility]    58 y.o.   Phone: [phone]  Lung Screening Narrative (confirm age 41-77 yrs Medicare / 50-80 yrs Private pay insurance)   Insurance information:[employer]   Referring Provider: Reuel Boom, MD   This screening involves an initial phone call with a team member from our program. It is called a shared decision making visit. The initial meeting is required by  insurance and Medicare to make sure you understand the program. This appointment takes about 15-20 minutes to complete. You will complete the screening scan at your scheduled date/time.  This scan takes about 5-10 minutes to complete. You can eat and drink normally before and after the scan.  Criteria questions for Lung Cancer Screening:   Are you a current or former smoker? Current Age began smoking: 16   If you are a former smoker, what year did you quit smoking? Quit 2 years(within 15 yrs)   To calculate your smoking history, I need an accurate estimate of how many packs of cigarettes you smoked per day and for how many years. (Not just the number of PPD you are now smoking)   Years smoking 39 x Packs per day 1 = Pack years 39   (at least 20 pack yrs)   (Make sure they understand that we need to know how much they have smoked in the past, not just the number of PPD they are smoking now)  Do you have a personal history of cancer?  No    Do you have a family history of cancer? No  Are you coughing up blood?  No  Have you had unexplained weight loss of 15 lbs or more in the last 6 months? No  It looks like you meet all criteria.  When would be a good time for [place] to schedule you for this screening?   Additional information: N/A

## 2023-07-10 ENCOUNTER — Encounter: Payer: Self-pay | Admitting: Physician Assistant

## 2023-07-10 ENCOUNTER — Ambulatory Visit: Payer: PRIVATE HEALTH INSURANCE | Admitting: Physician Assistant

## 2023-07-10 DIAGNOSIS — F17210 Nicotine dependence, cigarettes, uncomplicated: Secondary | ICD-10-CM | POA: Diagnosis not present

## 2023-07-10 NOTE — Progress Notes (Signed)
 Virtual Visit via Telephone Note  I connected with Jeff Anderson on 07/10/23 at  10:57 AM  by telephone and verified that I am speaking with the correct person using two identifiers.  Location: Patient: home Provider: working virtually from home   I discussed the limitations, risks, security and privacy concerns of performing an evaluation and management service by telephone and the availability of in person appointments. I also discussed with the patient that there may be a patient responsible charge related to this service. The patient expressed understanding and agreed to proceed.     Shared Decision Making Visit Lung Cancer Screening Program [phone])   Eligibility: Age 16 Pack Years Smoking History Calculation 39 (# packs/per year x # years smoked) Recent History of coughing up blood  No Unexplained weight loss? No ( >Than 15 pounds within the last 6 months ) Prior History Lung / other cancer No (Diagnosis within the last 5 years already requiring surveillance chest CT Scans). Smoking Status Current Smoker  Visit Components: Discussion included one or more decision making aids. Yes Discussion included risk/benefits of screening. Yes Discussion included potential follow up diagnostic testing for abnormal scans. Yes Discussion included meaning and risk of over diagnosis. Yes Discussion included meaning and risk of False Positives. Yes Discussion included meaning of total radiation exposure. Yes  Counseling Included: Importance of adherence to annual lung cancer LDCT screening. Yes Impact of comorbidities on ability to participate in the program. Yes Ability and willingness to under diagnostic treatment: Yes  Smoking Cessation Counseling: Current Smokers:  Discussed importance of smoking cessation. Yes Information about tobacco cessation classes and interventions provided to patient. Yes Symptomatic Patient. No Diagnosis Code: Tobacco Use Z72.0 Asymptomatic Patient  Yes  Counseling (Intermediate counseling: > three minutes counseling) [id number] Information about tobacco cessation classes and interventions provided to patient. Yes Written Order for Lung Cancer Screening with LDCT placed in Epic. Yes (CT Chest Lung Cancer Screening Low Dose W/O CM) [id number] Z12.2-Screening of respiratory organs Z87.891-Personal history of nicotine dependence   I have spent 25 minutes of face to face/ virtual visit  time with the patient discussing the risks and benefits of lung cancer screening. We discussed the above noted topics. We paused at intervals to allow for questions to be asked and answered to ensure understanding.We discussed that the single most powerful action that anyone can take to decrease their risk of developing lung cancer is to quit smoking.  We discussed options for tools to aid in quitting smoking including nicotine replacement therapy, non-nicotine medications, support groups, Quit Smart classes, and behavior modification. We discussed that often times setting smaller, more achievable goals, such as eliminating 1 cigarette a day for a week and then 2 cigarettes a day for a week can be helpful in slowly decreasing the number of cigarettes smoked. I provided  them  with smoking cessation  information  with contact information for community resources, classes, free nicotine replacement therapy, and access to mobile apps, text messaging, and on-line smoking cessation help. I have also provided  them  the office contact information in the event they have any questions. We discussed the time and location of the scan, and that either Jeff Miyamoto RN, Jeff Lemon, RN  or I will call / send a letter with the results within 24-72 hours of receiving them. The patient verbalized understanding of all of  the above and had no further questions upon leaving the office. They have my contact information in the event they have any  further questions.  I spent 3 minutes counseling  on smoking cessation and the health risks of continued tobacco abuse.  I explained to the patient that there has been a high incidence of coronary artery disease noted on these exams. I explained that this is a non-gated exam therefore degree or severity cannot be determined. This patient is not on statin therapy. I have asked the patient to follow-up with their PCP regarding any incidental finding of coronary artery disease and management with diet or medication as their PCP  feels is clinically indicated. The patient verbalized understanding of the above and had no further questions upon completion of the visit.    Jeff Gasman [name], PA-C

## 2023-07-10 NOTE — Patient Instructions (Signed)

## 2023-07-11 ENCOUNTER — Ambulatory Visit (HOSPITAL_COMMUNITY): Payer: PRIVATE HEALTH INSURANCE

## 2023-07-12 ENCOUNTER — Encounter (INDEPENDENT_AMBULATORY_CARE_PROVIDER_SITE_OTHER): Payer: Self-pay | Admitting: *Deleted

## 2023-08-22 ENCOUNTER — Encounter (INDEPENDENT_AMBULATORY_CARE_PROVIDER_SITE_OTHER): Payer: Self-pay | Admitting: Gastroenterology

## 2024-02-06 ENCOUNTER — Encounter (INDEPENDENT_AMBULATORY_CARE_PROVIDER_SITE_OTHER): Payer: Self-pay | Admitting: Gastroenterology
